# Patient Record
Sex: Male | Born: 1990 | Race: White | Hispanic: No | Marital: Married | Smoking: Current every day smoker
Health system: Southern US, Community
[De-identification: ages and names within clinical notes are randomized; demographics above are authoritative.]

---
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
## 2008-06-08 ENCOUNTER — Emergency Department: Payer: Self-pay | Admitting: Emergency Medicine

## 2009-06-04 ENCOUNTER — Inpatient Hospital Stay: Payer: Self-pay | Admitting: Unknown Physician Specialty

## 2011-05-09 ENCOUNTER — Emergency Department: Payer: Self-pay | Admitting: Emergency Medicine

## 2014-06-12 ENCOUNTER — Emergency Department: Payer: Self-pay | Admitting: Emergency Medicine

## 2016-06-05 ENCOUNTER — Encounter: Payer: Self-pay | Admitting: Emergency Medicine

## 2016-06-05 ENCOUNTER — Emergency Department
Admission: EM | Admit: 2016-06-05 | Discharge: 2016-06-05 | Disposition: A | Discharge status: Home / Self Care | Payer: Managed Care, Other (non HMO) | Attending: Emergency Medicine | Admitting: Emergency Medicine

## 2016-06-05 ENCOUNTER — Emergency Department: Payer: Managed Care, Other (non HMO)

## 2016-06-05 DIAGNOSIS — M545 Low back pain, unspecified: Secondary | ICD-10-CM

## 2016-06-05 DIAGNOSIS — W010XXA Fall on same level from slipping, tripping and stumbling without subsequent striking against object, initial encounter: Secondary | ICD-10-CM | POA: Insufficient documentation

## 2016-06-05 DIAGNOSIS — Y939 Activity, unspecified: Secondary | ICD-10-CM | POA: Diagnosis not present

## 2016-06-05 DIAGNOSIS — G8929 Other chronic pain: Secondary | ICD-10-CM

## 2016-06-05 DIAGNOSIS — Y929 Unspecified place or not applicable: Secondary | ICD-10-CM | POA: Insufficient documentation

## 2016-06-05 DIAGNOSIS — Y999 Unspecified external cause status: Secondary | ICD-10-CM | POA: Diagnosis not present

## 2016-06-05 DIAGNOSIS — S4992XA Unspecified injury of left shoulder and upper arm, initial encounter: Secondary | ICD-10-CM | POA: Diagnosis present

## 2016-06-05 DIAGNOSIS — S46912A Strain of unspecified muscle, fascia and tendon at shoulder and upper arm level, left arm, initial encounter: Secondary | ICD-10-CM | POA: Diagnosis not present

## 2016-06-05 MED ORDER — TRAMADOL HCL 50 MG PO TABS: 50.0000 mg | ORAL_TABLET | Freq: Four times a day (QID) | ORAL | 0 refills | Status: DC | PRN

## 2016-06-05 MED ORDER — METHOCARBAMOL 750 MG PO TABS: 750.0000 mg | ORAL_TABLET | Freq: Four times a day (QID) | ORAL | 0 refills | Status: DC

## 2016-06-05 NOTE — ED Triage Notes (Signed)
Pt reports hx of back pain with his current job, reports he slipped yesterday and is now having left shoulder/upper back pain. Pt ambulatory with full ROM in triage.

## 2016-06-05 NOTE — ED Notes (Signed)
See triage note  Slipped yesterday   Having pain to upper back and into left shoulder   No deformity noted  Ambulates well to treatment area

## 2016-06-05 NOTE — ED Provider Notes (Signed)
[facility] Emergency Department Provider Note   ____________________________________________   First MD Initiated Contact with Patient 06/05/16 1003     (approximate)  I have reviewed the triage vital signs and the nursing notes.   HISTORY  Chief Complaint Back Pain    HPI Jack Fields is a 26 y.o. male patient complaining of low back pain and left posterior shoulder pain secondary to a slip and fall yesterday. Patient has a history of chronic low back pain which has never been evaluated. Patient is back pain worsen secondary to his fall. Patient denies any radicular component to his back pain. Patient denies any bladder or bowel dysfunction. Patient state pain and left scapular area secondary with abduction overhead reaching of the left upper extremity. Patient rates his pain as 8/10. Patient described the pain as "achy". Patient stated no relief taking over-the-counter anti-inflammatory medications. History reviewed. No pertinent past medical history.  There are no active problems to display for this patient.   No past surgical history on file.  Prior to Admission medications   Medication Sig Start Date End Date Taking? Authorizing Provider  methocarbamol (ROBAXIN-750) 750 MG tablet Take 1 tablet (750 mg total) by mouth 4 (four) times daily. 06/05/16   Joni Reiningonald K Smith, PA-C  traMADol (ULTRAM) 50 MG tablet Take 1 tablet (50 mg total) by mouth every 6 (six) hours as needed. 06/05/16 06/05/17  Joni Reiningonald K Smith, PA-C    Allergies Patient has no known allergies.  No family history on file.  Social History Social History  Substance Use Topics  . Smoking status: Not on file  . Smokeless tobacco: Not on file  . Alcohol use Not on file    Review of Systems Constitutional: No fever/chills Eyes: No visual changes. ENT: No sore throat. Cardiovascular: Denies chest pain. Respiratory: Denies shortness of breath. Gastrointestinal: No abdominal pain.   No nausea, no vomiting.  No diarrhea.  No constipation. Genitourinary: Negative for dysuria. Musculoskeletal: Left posterior shoulder and back pain. Skin: Negative for rash. Neurological: Negative for headaches, focal weakness or numbness.   ____________________________________________   PHYSICAL EXAM:  VITAL SIGNS: ED Triage Vitals  Enc Vitals Group     BP 06/05/16 0900 140/81     Pulse Rate 06/05/16 0900 86     Resp 06/05/16 0900 16     Temp 06/05/16 0900 98.2 F (36.8 C)     Temp Source 06/05/16 0900 Oral     SpO2 06/05/16 0900 98 %     Weight 06/05/16 0901 190 lb (86.2 kg)     Height 06/05/16 0901 5\' 9"  (1.753 m)     Head Circumference --      Peak Flow --      Pain Score 06/05/16 0901 8     Pain Loc --      Pain Edu? --      Excl. in GC? --     Constitutional: Alert and oriented. Well appearing and in no acute distress. Eyes: Conjunctivae are normal. PERRL. EOMI. Head: Atraumatic. Nose: No congestion/rhinnorhea. Mouth/Throat: Mucous membranes are moist.  Oropharynx non-erythematous. Neck: No stridor.  No cervical spine tenderness to palpation. Hematological/Lymphatic/Immunilogical: No cervical lymphadenopathy. Cardiovascular: Normal rate, regular rhythm. Grossly normal heart sounds.  Good peripheral circulation. Respiratory: Normal respiratory effort.  No retractions. Lungs CTAB. Gastrointestinal: Soft and nontender. No distention. No abdominal bruits. No CVA tenderness. Musculoskeletal: No obvious deformity to the left posterior shoulder. Patient is moderate guarding palpation mid scapular muscle group. Patient has  decreased range of motion to the back complaining of pain with abduction and opiate reaching. Examination of the back shows no obvious spinal deformity. Patient has moderate guarding palpation L4-S1. Patient decreased range of motion with flexion and back complaining of pain. Patient has negative straight leg test. Neurologic:  Normal speech and language.  No gross focal neurologic deficits are appreciated. No gait instability. Skin:  Skin is warm, dry and intact. No rash noted. Psychiatric: Mood and affect are normal. Speech and behavior are normal.  ____________________________________________   LABS (all labs ordered are listed, but only abnormal results are displayed)  Labs Reviewed - No data to display ____________________________________________  EKG   ____________________________________________  RADIOLOGY  X-rays reveal only mild degenerative changes of the lumbar spine. ____________________________________________   PROCEDURES  Procedure(s) performed: None  Procedures  Critical Care performed: No  ____________________________________________   INITIAL IMPRESSION / ASSESSMENT AND PLAN / ED COURSE  Pertinent labs & imaging results that were available during my care of the patient were reviewed by me and considered in my medical decision making (see chart for details).  Left scapular muscle strain and low back pain. Patient given discharge care instructions. Patient given work no. Patient given prescription for tramadol and Robaxin. Patient advised to follow-up family doctor condition persists.      ____________________________________________   FINAL CLINICAL IMPRESSION(S) / ED DIAGNOSES  Final diagnoses:  Muscle strain of left scapular region, initial encounter  Chronic midline low back pain without sciatica      NEW MEDICATIONS STARTED DURING THIS VISIT:  New Prescriptions   METHOCARBAMOL (ROBAXIN-750) 750 MG TABLET    Take 1 tablet (750 mg total) by mouth 4 (four) times daily.   TRAMADOL (ULTRAM) 50 MG TABLET    Take 1 tablet (50 mg total) by mouth every 6 (six) hours as needed.     Note:  This document was prepared using Dragon voice recognition software and may include unintentional dictation errors.    Joni Reining, PA-C 06/05/16 1049    Minna Antis, MD 06/05/16 1450

## 2016-09-12 ENCOUNTER — Emergency Department
Admission: EM | Admit: 2016-09-12 | Discharge: 2016-09-12 | Disposition: A | Discharge status: Home / Self Care | Payer: Managed Care, Other (non HMO) | Attending: Emergency Medicine | Admitting: Emergency Medicine

## 2016-09-12 ENCOUNTER — Encounter: Payer: Self-pay | Admitting: Emergency Medicine

## 2016-09-12 DIAGNOSIS — R21 Rash and other nonspecific skin eruption: Secondary | ICD-10-CM | POA: Insufficient documentation

## 2016-09-12 DIAGNOSIS — F17200 Nicotine dependence, unspecified, uncomplicated: Secondary | ICD-10-CM | POA: Diagnosis not present

## 2016-09-12 MED ORDER — HYDROCORTISONE 1 % EX CREA: 1.0000 "application " | TOPICAL_CREAM | Freq: Three times a day (TID) | CUTANEOUS | 0 refills | Status: DC

## 2016-09-12 NOTE — ED Provider Notes (Signed)
Jack Fields Provider Note   ____________________________________________   I have reviewed the triage vital signs and the nursing notes.   HISTORY  Chief Complaint Rash    HPI Jack Fields is a 26 y.o. male presents with rash along bilateral inner thighs for approximately 1 year. Patient describes raised, erythematous and itchy when in hot, humid environments. The rash appears like stretch marks, non-infective along the inner thighs. He denies recent weight change.    History reviewed. No pertinent past medical history.  There are no active problems to display for this patient.   History reviewed. No pertinent surgical history.  Prior to Admission medications   Medication Sig Start Date End Date Taking? Authorizing Provider  hydrocortisone cream 1 % Apply 1 application topically 3 (three) times daily. 09/12/16   Chrisma Hurlock M, PA-C  methocarbamol (ROBAXIN-750) 750 MG tablet Take 1 tablet (750 mg total) by mouth 4 (four) times daily. 06/05/16   Joni Reining, PA-C  traMADol (ULTRAM) 50 MG tablet Take 1 tablet (50 mg total) by mouth every 6 (six) hours as needed. 06/05/16 06/05/17  Joni Reining, PA-C    Allergies Patient has no known allergies.  No family history on file.  Social History Social History  Substance Use Topics  . Smoking status: Current Every Day Smoker  . Smokeless tobacco: Never Used  . Alcohol use No    Review of Systems Constitutional: No fever/chills ENT: No sore throat. Cardiovascular: Denies chest pain. Respiratory: Denies cough Gastrointestinal: No abdominal pain.  No nausea, no vomiting.   Genitourinary: Negative for dysuria. Musculoskeletal: Negative for back pain. Skin: Rash along bilateral inner thighs. Neurological: Negative for headaches  ____________________________________________   PHYSICAL EXAM:  VITAL SIGNS: ED Triage Vitals  Enc Vitals Group     BP 09/12/16 1123  115/85     Pulse Rate 09/12/16 1116 80     Resp 09/12/16 1116 15     Temp 09/12/16 1116 98.4 F (36.9 C)     Temp Source 09/12/16 1116 Oral     SpO2 09/12/16 1116 100 %     Weight 09/12/16 1123 190 lb (86.2 kg)     Height 09/12/16 1123 5\' 10"  (1.778 m)     Head Circumference --      Peak Flow --      Pain Score --      Pain Loc --      Pain Edu? --      Excl. in GC? --     Constitutional: Alert and oriented. Well appearing and in no acute distress. Head: Atraumatic. Cardiovascular: Normal rate, regular rhythm.  Good peripheral circulation. Respiratory: Normal respiratory effort.  Genitourinary: deferred Musculoskeletal: No lower extremity tenderness nor edema.  No joint effusions. Neurologic:  Normal speech and language. No gross focal neurologic deficits are appreciated.  Skin:  Skin is warm, dry and intact. Bilateral inner thigh rash: stretch mark-like. Non-scaly, not-raised.  Psychiatric: Mood and affect are normal. Speech and behavior are normal.  ____________________________________________   LABS (all labs ordered are listed, but only abnormal results are displayed)  Labs Reviewed - No data to display ____________________________________________  EKG none  ____________________________________________  RADIOLOGY none ____________________________________________   PROCEDURES  Procedure(s) performed: none    Critical Care performed: no ____________________________________________   INITIAL IMPRESSION / ASSESSMENT AND PLAN / ED COURSE  Pertinent labs & imaging results that were available during my care of the patient were reviewed by me and considered in  my medical decision making (see chart for details).  Patient presents with intermittent inflammed rash along bilateral inner thighs. Generally more symptomatic with hot and humid environments. The rash appears stretch mark-like and non-infective. Patient will be prescribed 1% Cortisone cream to apply for  7 days and recommended to follow up with his PCP as needed or emergency Fields if symptoms worsen.      __________________________________________   FINAL CLINICAL IMPRESSION(S) / ED DIAGNOSES  Final diagnoses:  Rash and nonspecific skin eruption      NEW MEDICATIONS STARTED DURING THIS VISIT:  Discharge Medication List as of 09/12/2016 11:38 AM    START taking these medications   Details  hydrocortisone cream 1 % Apply 1 application topically 3 (three) times daily., Starting Tue 09/12/2016, Print         Note:  This document was prepared using Dragon voice recognition software and may include unintentional dictation errors.   [name], [name] M, PA-C 09/12/16 1348    Jene EveryKinner, Robert, MD 09/12/16 1351

## 2016-09-12 NOTE — ED Triage Notes (Signed)
States he developed rash to inside of upper legs   States hx of same in past  Worse when area gets hot

## 2017-04-02 ENCOUNTER — Emergency Department
Admission: EM | Admit: 2017-04-02 | Discharge: 2017-04-02 | Disposition: A | Discharge status: Home / Self Care | Payer: Managed Care, Other (non HMO) | Attending: Emergency Medicine | Admitting: Emergency Medicine

## 2017-04-02 ENCOUNTER — Other Ambulatory Visit: Payer: Self-pay

## 2017-04-02 DIAGNOSIS — X509XXA Other and unspecified overexertion or strenuous movements or postures, initial encounter: Secondary | ICD-10-CM | POA: Diagnosis not present

## 2017-04-02 DIAGNOSIS — Y998 Other external cause status: Secondary | ICD-10-CM | POA: Diagnosis not present

## 2017-04-02 DIAGNOSIS — Y929 Unspecified place or not applicable: Secondary | ICD-10-CM | POA: Diagnosis not present

## 2017-04-02 DIAGNOSIS — T148XXA Other injury of unspecified body region, initial encounter: Secondary | ICD-10-CM

## 2017-04-02 DIAGNOSIS — M545 Low back pain, unspecified: Secondary | ICD-10-CM

## 2017-04-02 DIAGNOSIS — Z79899 Other long term (current) drug therapy: Secondary | ICD-10-CM | POA: Diagnosis not present

## 2017-04-02 DIAGNOSIS — Y9389 Activity, other specified: Secondary | ICD-10-CM | POA: Diagnosis not present

## 2017-04-02 DIAGNOSIS — F17200 Nicotine dependence, unspecified, uncomplicated: Secondary | ICD-10-CM | POA: Diagnosis not present

## 2017-04-02 DIAGNOSIS — S3992XA Unspecified injury of lower back, initial encounter: Secondary | ICD-10-CM | POA: Diagnosis present

## 2017-04-02 DIAGNOSIS — S39012A Strain of muscle, fascia and tendon of lower back, initial encounter: Secondary | ICD-10-CM | POA: Insufficient documentation

## 2017-04-02 LAB — URINALYSIS, COMPLETE (UACMP) WITH MICROSCOPIC
Bacteria, UA: NONE SEEN
Bilirubin Urine: NEGATIVE
Glucose, UA: NEGATIVE mg/dL
Hgb urine dipstick: NEGATIVE
KETONES UR: NEGATIVE mg/dL
Leukocytes, UA: NEGATIVE
Nitrite: NEGATIVE
PROTEIN: NEGATIVE mg/dL
Specific Gravity, Urine: 1.019 (ref 1.005–1.030)
Squamous Epithelial / LPF: NONE SEEN
pH: 7 (ref 5.0–8.0)

## 2017-04-02 MED ORDER — IBUPROFEN 400 MG PO TABS
600.0000 mg | ORAL_TABLET | Freq: Once | ORAL | Status: AC
  Administered 2017-04-02: 600 mg via ORAL
  Filled 2017-04-02: qty 2

## 2017-04-02 MED ORDER — IBUPROFEN 600 MG PO TABS: 600.0000 mg | ORAL_TABLET | Freq: Three times a day (TID) | ORAL | 0 refills | Status: DC | PRN

## 2017-04-02 MED ORDER — METHOCARBAMOL 500 MG PO TABS
1000.0000 mg | ORAL_TABLET | Freq: Once | ORAL | Status: AC
  Administered 2017-04-02: 1000 mg via ORAL
  Filled 2017-04-02 (×2): qty 2

## 2017-04-02 MED ORDER — METHOCARBAMOL 500 MG PO TABS: 500.0000 mg | ORAL_TABLET | Freq: Four times a day (QID) | ORAL | 0 refills | Status: DC | PRN

## 2017-04-02 NOTE — ED Provider Notes (Signed)
[facility] Emergency Department Provider Note  ____________________________________________   First MD Initiated Contact with Patient 04/02/17 1109     (approximate)  I have reviewed the triage vital signs and the nursing notes.   HISTORY  Chief Complaint Flank Pain    HPI Jack Fields is a 26 y.o. male who self presents to the emergency department requesting a work note for left-sided low back pain.  He works as a Estate agentforklift operator and for the past 4 days or so he has had moderate severity throbbing aching left low back pain.  Pain is nonradiating.  It is worse with movement and lifting and improved with rest.  He has taken Flexeril in the past for identical symptoms with minimal relief.  He went to work today was advised to come to the hospital to get a work note otherwise he could not come back.  He denies bowel or bladder incontinence or heaviness.  He denies fevers or chills.  He denies IV drug use.     No past medical history on file.  There are no active problems to display for this patient.   No past surgical history on file.  Prior to Admission medications   Medication Sig Start Date End Date Taking? Authorizing Provider  hydrocortisone cream 1 % Apply 1 application topically 3 (three) times daily. 09/12/16   Little, Traci M, PA-C  ibuprofen (ADVIL,MOTRIN) 600 MG tablet Take 1 tablet (600 mg total) by mouth every 8 (eight) hours as needed. 04/02/17   Merrily Brittleifenbark, Catelyn Friel, MD  methocarbamol (ROBAXIN) 500 MG tablet Take 1 tablet (500 mg total) by mouth every 6 (six) hours as needed for muscle spasms. 04/02/17   Merrily Brittleifenbark, Miklo Aken, MD  methocarbamol (ROBAXIN-750) 750 MG tablet Take 1 tablet (750 mg total) by mouth 4 (four) times daily. 06/05/16   Joni ReiningSmith, Ronald K, PA-C  traMADol (ULTRAM) 50 MG tablet Take 1 tablet (50 mg total) by mouth every 6 (six) hours as needed. 06/05/16 06/05/17  Joni ReiningSmith, Ronald K, PA-C    Allergies Patient has no known  allergies.  No family history on file.  Social History Social History   Tobacco Use  . Smoking status: Current Every Day Smoker  . Smokeless tobacco: Never Used  Substance Use Topics  . Alcohol use: No  . Drug use: Not on file    Review of Systems Constitutional: No fever/chills ENT: No sore throat. Cardiovascular: Denies chest pain. Respiratory: Denies shortness of breath. Gastrointestinal: No abdominal pain.  No nausea, no vomiting.  No diarrhea.  No constipation. Musculoskeletal: Positive for back pain. Neurological: Negative for headaches   ____________________________________________   PHYSICAL EXAM:  VITAL SIGNS: ED Triage Vitals  Enc Vitals Group     BP 04/02/17 0949 (!) 154/90     Pulse Rate 04/02/17 0949 81     Resp 04/02/17 0949 20     Temp 04/02/17 0949 98.1 F (36.7 C)     Temp Source 04/02/17 0949 Oral     SpO2 04/02/17 0949 99 %     Weight 04/02/17 0950 200 lb (90.7 kg)     Height 04/02/17 0950 6' (1.829 m)     Head Circumference --      Peak Flow --      Pain Score 04/02/17 0949 6     Pain Loc --      Pain Edu? --      Excl. in GC? --     Constitutional: Alert and oriented x4 pleasant cooperative  speaks full clear sentences no diaphoresis Head: Atraumatic. Nose: No congestion/rhinnorhea. Mouth/Throat: No trismus Neck: No stridor.   Cardiovascular: Regular rate and rhythm Respiratory: Normal respiratory effort.  No retractions. MSK: No midline back tenderness he is tender left low back lumbar with some spasm normal neurological exam Neurologic:  Normal speech and language. No gross focal neurologic deficits are appreciated.  Skin:  Skin is warm, dry and intact. No rash noted.    ____________________________________________  LABS (all labs ordered are listed, but only abnormal results are displayed)  Labs Reviewed  URINALYSIS, COMPLETE (UACMP) WITH MICROSCOPIC - Abnormal; Notable for the following components:      Result Value    Color, Urine YELLOW (*)    APPearance CLEAR (*)    All other components within normal limits     __________________________________________  EKG   ____________________________________________  RADIOLOGY   ____________________________________________   DIFFERENTIAL includes but not limited to  Muscle spasm, radicular pain, cauda equina, epidural abscess, kidney stone   PROCEDURES  Procedure(s) performed: no  Procedures  Critical Care performed: no  Observation: no ____________________________________________   INITIAL IMPRESSION / ASSESSMENT AND PLAN / ED COURSE  Pertinent labs & imaging results that were available during my care of the patient were reviewed by me and considered in my medical decision making (see chart for details).  Patient is very well-appearing with normal neurological exam.  Urinalysis is negative for blood in his history is not consistent with renal colic.  This feels identical to previous episodes of low back pain with spasm.  Given Robaxin today for symptomatic control as he has failed Flexeril in the past.  Strict return precautions have been given to the patient verbalized understanding and agreement with plan.      ____________________________________________   FINAL CLINICAL IMPRESSION(S) / ED DIAGNOSES  Final diagnoses:  Acute left-sided low back pain without sciatica  Muscle strain      NEW MEDICATIONS STARTED DURING THIS VISIT:  This SmartLink is deprecated. Use AVSMEDLIST instead to display the medication list for a patient.   Note:  This document was prepared using Dragon voice recognition software and may include unintentional dictation errors.      Merrily Brittleifenbark, Dawud Mays, MD 04/02/17 1145

## 2017-04-02 NOTE — ED Triage Notes (Signed)
Pt reports that he is having left flank pain since Thanksgiving. He denies any pain or blood during urination.

## 2017-04-02 NOTE — Discharge Instructions (Signed)
Please take your muscle relaxants as needed for severe symptoms and follow-up with your primary care physician as needed.  Return to the emergency department for any concerns whatsoever.  It was a pleasure to take care of you today, and thank you for coming to our emergency department.  If you have any questions or concerns before leaving please ask the nurse to grab me and I'm more than happy to go through your aftercare instructions again.  If you were prescribed any opioid pain medication today such as Norco, Vicodin, Percocet, morphine, hydrocodone, or oxycodone please make sure you do not drive when you are taking this medication as it can alter your ability to drive safely.  If you have any concerns once you are home that you are not improving or are in fact getting worse before you can make it to your follow-up appointment, please do not hesitate to call 911 and come back for further evaluation.  Merrily BrittleNeil Ishita Mcnerney, MD  Results for orders placed or performed during the hospital encounter of 04/02/17  Urinalysis, Complete w Microscopic  Result Value Ref Range   Color, Urine YELLOW (A) YELLOW   APPearance CLEAR (A) CLEAR   Specific Gravity, Urine 1.019 1.005 - 1.030   pH 7.0 5.0 - 8.0   Glucose, UA NEGATIVE NEGATIVE mg/dL   Hgb urine dipstick NEGATIVE NEGATIVE   Bilirubin Urine NEGATIVE NEGATIVE   Ketones, ur NEGATIVE NEGATIVE mg/dL   Protein, ur NEGATIVE NEGATIVE mg/dL   Nitrite NEGATIVE NEGATIVE   Leukocytes, UA NEGATIVE NEGATIVE   RBC / HPF 0-5 0 - 5 RBC/hpf   WBC, UA 0-5 0 - 5 WBC/hpf   Bacteria, UA NONE SEEN NONE SEEN   Squamous Epithelial / LPF NONE SEEN NONE SEEN   Mucus PRESENT

## 2017-05-10 ENCOUNTER — Emergency Department
Admission: EM | Admit: 2017-05-10 | Discharge: 2017-05-10 | Disposition: A | Discharge status: Home / Self Care | Payer: Managed Care, Other (non HMO) | Attending: Emergency Medicine | Admitting: Emergency Medicine

## 2017-05-10 ENCOUNTER — Other Ambulatory Visit: Payer: Self-pay

## 2017-05-10 ENCOUNTER — Encounter: Payer: Self-pay | Admitting: Emergency Medicine

## 2017-05-10 DIAGNOSIS — J1189 Influenza due to unidentified influenza virus with other manifestations: Secondary | ICD-10-CM | POA: Insufficient documentation

## 2017-05-10 DIAGNOSIS — J111 Influenza due to unidentified influenza virus with other respiratory manifestations: Secondary | ICD-10-CM

## 2017-05-10 DIAGNOSIS — F17200 Nicotine dependence, unspecified, uncomplicated: Secondary | ICD-10-CM | POA: Insufficient documentation

## 2017-05-10 DIAGNOSIS — J029 Acute pharyngitis, unspecified: Secondary | ICD-10-CM | POA: Diagnosis present

## 2017-05-10 LAB — GROUP A STREP BY PCR

## 2017-05-10 MED ORDER — OSELTAMIVIR PHOSPHATE 75 MG PO CAPS: 75.0000 mg | ORAL_CAPSULE | Freq: Two times a day (BID) | ORAL | 0 refills | Status: DC

## 2017-05-10 NOTE — Discharge Instructions (Signed)
Follow up with your regular doctor if not better in 3 days, use tylenol and motrin for fever and body aches as needed, take the medication as prescribed

## 2017-05-10 NOTE — ED Triage Notes (Signed)
Pt to ed with c/o sore throat since yesterday, and headache. Denies fever.

## 2017-05-10 NOTE — ED Provider Notes (Signed)
[facility] Emergency Department Provider Note  ____________________________________________   First MD Initiated Contact with Patient 05/10/17 1020     (approximate)  I have reviewed the triage vital signs and the nursing notes.   HISTORY  Chief Complaint Sore Throat    HPI Jack Fields is a 27 y.o. male complaints of sore throat times 1 day, he states he has been hoarse also, no fever or chills, he states he felt hot, some body aches, started with cough this morning, it is nonproductive, he is not taking any over-the-counter medicines to help his symptoms, he denies vomiting, diarrhea, chest pain or shortness of breath   History reviewed. No pertinent past medical history.  There are no active problems to display for this patient.   History reviewed. No pertinent surgical history.  Prior to Admission medications   Medication Sig Start Date End Date Taking? Authorizing Provider  oseltamivir (TAMIFLU) 75 MG capsule Take 1 capsule (75 mg total) by mouth 2 (two) times daily. 05/10/17   Faythe GheeFisher, Susan W, PA-C    Allergies Patient has no known allergies.  History reviewed. No pertinent family history.  Social History Social History   Tobacco Use  . Smoking status: Current Every Day Smoker  . Smokeless tobacco: Never Used  Substance Use Topics  . Alcohol use: No  . Drug use: No    Review of Systems  Constitutional: Unsure of fever/chills Eyes: No visual changes. ENT: Positive sore throat. Respiratory: Positive cough Genitourinary: Negative for dysuria. Musculoskeletal: Negative for back pain. Skin: Negative for rash.    ____________________________________________   PHYSICAL EXAM:  VITAL SIGNS: ED Triage Vitals [05/10/17 0852]  Enc Vitals Group     BP 135/77     Pulse Rate 88     Resp 18     Temp 98.2 F (36.8 C)     Temp Source Oral     SpO2 100 %     Weight 200 lb (90.7 kg)     Height      Head Circumference    Peak Flow      Pain Score 5     Pain Loc      Pain Edu?      Excl. in GC?     Constitutional: Alert and oriented. Well appearing and in no acute distress. Eyes: Conjunctivae are normal.  Head: Atraumatic. Nose: No congestion/rhinnorhea. Mouth/Throat: Mucous membranes are moist.  Throat is normal Cardiovascular: Normal rate, regular rhythm.  Heart sounds are normal Respiratory: Normal respiratory effort.  No retractions, lungs are clear to auscultation, voice is very hoarse, cough is dry and hacking GU: deferred Musculoskeletal: FROM all extremities, warm and well perfused Neurologic:  Normal speech and language.  Skin:  Skin is warm, dry and intact. No rash noted. Psychiatric: Mood and affect are normal. Speech and behavior are normal.  ____________________________________________   LABS (all labs ordered are listed, but only abnormal results are displayed)  Labs Reviewed  GROUP A STREP BY PCR - Abnormal; Notable for the following components:      Result Value   Group A Strep by PCR   (*)    Value: INVALID, UNABLE TO DETERMINE THE PRESENCE OF TARGET DNA DUE TO SPECIMEN INTEGRITY. RECOLLECTION REQUESTED.   All other components within normal limits  GROUP A STREP BY PCR   ____________________________________________   ____________________________________________  RADIOLOGY    ____________________________________________   PROCEDURES  Procedure(s) performed: No      ____________________________________________   INITIAL  IMPRESSION / ASSESSMENT AND PLAN / ED COURSE  Pertinent labs & imaging results that were available during my care of the patient were reviewed by me and considered in my medical decision making (see chart for details).  Patient is a 27 year old male complains of sore throat that started yesterday, on physical exam he has a dry hacking cough and a hoarse voice, his throat is not red, strep test was ordered by the nurse, the lab was unable to  result th this test due to an adequate swab, the patient When he has the flu, prescription for Tamiflu 75 mg twice daily was given, explained to him that he had an acute upper respiratory infection most likely had influenza, he was given a work note, he is not to be around people as long as he has a fever, he is to take over-the-counter Tylenol or ibuprofen, TheraFlu for symptoms, but patient states he understands all instructions and will comply with our recommendations, he was discharged in stable condition     As part of my medical decision making, I reviewed the following data within the electronic MEDICAL RECORD NUMBER ____________________________________________   FINAL CLINICAL IMPRESSION(S) / ED DIAGNOSES  Final diagnoses:  Influenza      NEW MEDICATIONS STARTED DURING THIS VISIT:  This SmartLink is deprecated. Use AVSMEDLIST instead to display the medication list for a patient.   Note:  This document was prepared using Dragon voice recognition software and may include unintentional dictation errors.    Faythe Ghee, PA-C 05/10/17 Kelby Fam, MD 05/23/17 [phone]

## 2017-05-10 NOTE — ED Notes (Signed)
See triage note  States he became hoarse yesterday  Woke up with sore throat this am  No fever

## 2017-06-04 ENCOUNTER — Emergency Department
Admission: EM | Admit: 2017-06-04 | Discharge: 2017-06-04 | Disposition: A | Discharge status: Home / Self Care | Payer: Managed Care, Other (non HMO) | Attending: Emergency Medicine | Admitting: Emergency Medicine

## 2017-06-04 ENCOUNTER — Encounter: Payer: Self-pay | Admitting: Medical Oncology

## 2017-06-04 DIAGNOSIS — R112 Nausea with vomiting, unspecified: Secondary | ICD-10-CM | POA: Insufficient documentation

## 2017-06-04 DIAGNOSIS — R197 Diarrhea, unspecified: Secondary | ICD-10-CM | POA: Insufficient documentation

## 2017-06-04 DIAGNOSIS — R1110 Vomiting, unspecified: Secondary | ICD-10-CM | POA: Diagnosis present

## 2017-06-04 DIAGNOSIS — F17200 Nicotine dependence, unspecified, uncomplicated: Secondary | ICD-10-CM | POA: Diagnosis not present

## 2017-06-04 LAB — COMPREHENSIVE METABOLIC PANEL
ALK PHOS: 84 U/L (ref 38–126)
ALT: 19 U/L (ref 17–63)
ANION GAP: 8 (ref 5–15)
AST: 22 U/L (ref 15–41)
Albumin: 5.1 g/dL — ABNORMAL HIGH (ref 3.5–5.0)
BILIRUBIN TOTAL: 1.4 mg/dL — AB (ref 0.3–1.2)
BUN: 20 mg/dL (ref 6–20)
CALCIUM: 9.6 mg/dL (ref 8.9–10.3)
CO2: 28 mmol/L (ref 22–32)
Chloride: 101 mmol/L (ref 101–111)
Creatinine, Ser: 1.28 mg/dL — ABNORMAL HIGH (ref 0.61–1.24)
Glucose, Bld: 119 mg/dL — ABNORMAL HIGH (ref 65–99)
Potassium: 5 mmol/L (ref 3.5–5.1)
SODIUM: 137 mmol/L (ref 135–145)
TOTAL PROTEIN: 8.1 g/dL (ref 6.5–8.1)

## 2017-06-04 LAB — CBC
HCT: 50.2 % (ref 40.0–52.0)
HEMOGLOBIN: 16.8 g/dL (ref 13.0–18.0)
MCH: 30.7 pg (ref 26.0–34.0)
MCHC: 33.5 g/dL (ref 32.0–36.0)
MCV: 91.6 fL (ref 80.0–100.0)
Platelets: 230 10*3/uL (ref 150–440)
RBC: 5.48 MIL/uL (ref 4.40–5.90)
RDW: 14.1 % (ref 11.5–14.5)
WBC: 8.3 10*3/uL (ref 3.8–10.6)

## 2017-06-04 LAB — LIPASE, BLOOD: Lipase: 26 U/L (ref 11–51)

## 2017-06-04 MED ORDER — ONDANSETRON 4 MG PO TBDP: ORAL_TABLET | ORAL | 0 refills | Status: DC

## 2017-06-04 MED ORDER — ONDANSETRON 4 MG PO TBDP
4.0000 mg | ORAL_TABLET | Freq: Once | ORAL | Status: AC
  Administered 2017-06-04: 4 mg via ORAL
  Filled 2017-06-04: qty 1

## 2017-06-04 NOTE — Discharge Instructions (Signed)

## 2017-06-04 NOTE — ED Notes (Signed)
Pt states started "getting sick" as in N&V around 1am this morning. States central abd pain. States diarrhea x 2 hours. Alert, oriented, ambulatory. No distress noted. Unsure of fever. Unsure if he has been around anyone sick recently. Still has gallbladder and appendix.

## 2017-06-04 NOTE — ED Triage Notes (Signed)
Pt reports he began having nausea vomiting and diarrhea last night. Pt in NAD at this time. Drinking Mt dew in lobby prior to triage.

## 2017-06-04 NOTE — ED Triage Notes (Signed)
FIRST NURSE NOTE-here for vomiting. ambulatory without difficulty. NAD

## 2017-06-04 NOTE — ED Provider Notes (Signed)
[facility] Emergency Department Provider Note  ____________________________________________   First MD Initiated Contact with Patient 06/04/17 1030     (approximate)  I have reviewed the triage vital signs and the nursing notes.   HISTORY  Chief Complaint Emesis    HPI Jack Fields is a 27 y.o. male who is otherwise healthy and presents for evaluation of acute onset vomiting and diarrhea during the night last night.  He was in his normal state of health when he went to bed last night but awoke between midnight and 1 AM with acute onset vomiting.  He says he has vomited at least 12 times since that time.  He started having multiple episodes of loose stool this morning.  He states the symptoms are the same as his young son had last week.  He has been able to [facility] some [facility] since coming to the emergency department.  He denies fever/chills, chest pain, shortness of breath, abdominal pain other than a little bit of muscle pain when he vomits, and dysuria.  Nothing in particular makes his symptoms worse and they have gotten better over the last couple of hours.  He was going to work but had to stop because of his vomiting and his employer told him to come by and get checked out.  He is in no acute distress at this time.  History reviewed. No pertinent past medical history.  There are no active problems to display for this patient.   History reviewed. No pertinent surgical history.  Prior to Admission medications   Medication Sig Start Date End Date Taking? Authorizing Provider  ondansetron (ZOFRAN ODT) 4 MG disintegrating tablet Allow 1-2 tablets to dissolve in your mouth every 8 hours as needed for nausea/vomiting 06/04/17   Loleta RoseForbach, Verdelle Valtierra, MD  oseltamivir (TAMIFLU) 75 MG capsule Take 1 capsule (75 mg total) by mouth 2 (two) times daily. 05/10/17   Faythe GheeFisher, Susan W, PA-C    Allergies Patient has no known allergies.  No family history on  file.  Social History Social History   Tobacco Use  . Smoking status: Current Every Day Smoker  . Smokeless tobacco: Never Used  Substance Use Topics  . Alcohol use: No  . Drug use: No    Review of Systems Constitutional: No fever/chills Eyes: No visual changes. ENT: No sore throat. Cardiovascular: Denies chest pain. Respiratory: Denies shortness of breath. Gastrointestinal: Acute onset vomiting and diarrhea starting last night persistent throughout the night, now improving.  No abdominal pain. Genitourinary: Negative for dysuria. Musculoskeletal: Negative for neck pain.  Negative for back pain. Integumentary: Negative for rash. Neurological: Negative for headaches, focal weakness or numbness.   ____________________________________________   PHYSICAL EXAM:  VITAL SIGNS: ED Triage Vitals  Enc Vitals Group     BP 06/04/17 0946 134/86     Pulse Rate 06/04/17 0946 89     Resp 06/04/17 0946 18     Temp 06/04/17 0946 98.4 F (36.9 C)     Temp Source 06/04/17 0946 Oral     SpO2 --      Weight 06/04/17 0947 88.5 kg (195 lb)     Height 06/04/17 0947 1.829 m (6')     Head Circumference --      Peak Flow --      Pain Score 06/04/17 0946 6     Pain Loc --      Pain Edu? --      Excl. in GC? --  Constitutional: Alert and oriented. Well appearing and in no acute distress. Eyes: Conjunctivae are normal.  Head: Atraumatic. Nose: No congestion/rhinnorhea. Mouth/Throat: Mucous membranes are moist. Neck: No stridor.  No meningeal signs.   Cardiovascular: Normal rate, regular rhythm. Good peripheral circulation. Grossly normal heart sounds. Respiratory: Normal respiratory effort.  No retractions. Lungs CTAB. Gastrointestinal: Soft and nontender. No distention.  Tolerating p.o. in the ED Musculoskeletal: No lower extremity tenderness nor edema. No gross deformities of extremities. Neurologic:  Normal speech and language. No gross focal neurologic deficits are appreciated.   Skin:  Skin is warm, dry and intact. No rash noted. Psychiatric: Mood and affect are normal. Speech and behavior are normal.  ____________________________________________   LABS (all labs ordered are listed, but only abnormal results are displayed)  Labs Reviewed  COMPREHENSIVE METABOLIC PANEL - Abnormal; Notable for the following components:      Result Value   Glucose, Bld 119 (*)    Creatinine, Ser 1.28 (*)    Albumin 5.1 (*)    Total Bilirubin 1.4 (*)    All other components within normal limits  LIPASE, BLOOD  CBC  URINALYSIS, COMPLETE (UACMP) WITH MICROSCOPIC   ____________________________________________  EKG  None - EKG not ordered by ED physician ____________________________________________  RADIOLOGY   No results found.  ____________________________________________   PROCEDURES  Critical Care performed: No   Procedure(s) performed:   Procedures   ____________________________________________   INITIAL IMPRESSION / ASSESSMENT AND PLAN / ED COURSE  As part of my medical decision making, I reviewed the following data within the electronic MEDICAL RECORD NUMBER Nursing notes reviewed and incorporated, Labs reviewed , Old chart reviewed and Notes from prior ED visits    Differential diagnosis includes, but is not limited to, viral gastroenteritis, appendicitis, biliary colic, alcoholic gastritis, SBO/ileus.  She is very well-appearing with normal vital signs.  Lab work is generally reassuring with a very slight elevation of his creatinine but a normal GFR.  Tolerating p.o. intake at this time.  He has no abdominal tenderness to palpation and is starting to feel better.  His son recently had the same symptoms.  There is no indication for any imaging at this time and I suspect he is suffering from a viral gastroenteritis.  He is comfortable with the plan for Zofran here, a prescription for Zofran, and I gave my usual and customary return precautions.      ____________________________________________  FINAL CLINICAL IMPRESSION(S) / ED DIAGNOSES  Final diagnoses:  Nausea vomiting and diarrhea     MEDICATIONS GIVEN DURING THIS VISIT:  Medications  ondansetron (ZOFRAN-ODT) disintegrating tablet 4 mg (4 mg Oral Given 06/04/17 1051)     ED Discharge Orders        Ordered    ondansetron (ZOFRAN ODT) 4 MG disintegrating tablet     06/04/17 1106       Note:  This document was prepared using Dragon voice recognition software and may include unintentional dictation errors.    Loleta Rose, MD 06/04/17 1108

## 2017-06-06 ENCOUNTER — Other Ambulatory Visit: Payer: Self-pay

## 2017-06-06 ENCOUNTER — Emergency Department
Admission: EM | Admit: 2017-06-06 | Discharge: 2017-06-06 | Disposition: A | Discharge status: Home / Self Care | Payer: Managed Care, Other (non HMO) | Attending: Emergency Medicine | Admitting: Emergency Medicine

## 2017-06-06 DIAGNOSIS — F17210 Nicotine dependence, cigarettes, uncomplicated: Secondary | ICD-10-CM | POA: Diagnosis not present

## 2017-06-06 DIAGNOSIS — Z79899 Other long term (current) drug therapy: Secondary | ICD-10-CM | POA: Diagnosis not present

## 2017-06-06 DIAGNOSIS — R112 Nausea with vomiting, unspecified: Secondary | ICD-10-CM | POA: Diagnosis present

## 2017-06-06 DIAGNOSIS — K529 Noninfective gastroenteritis and colitis, unspecified: Secondary | ICD-10-CM | POA: Diagnosis not present

## 2017-06-06 LAB — CBC
HCT: 47.2 % (ref 40.0–52.0)
HEMOGLOBIN: 16 g/dL (ref 13.0–18.0)
MCH: 30.7 pg (ref 26.0–34.0)
MCHC: 33.8 g/dL (ref 32.0–36.0)
MCV: 90.9 fL (ref 80.0–100.0)
Platelets: 196 10*3/uL (ref 150–440)
RBC: 5.2 MIL/uL (ref 4.40–5.90)
RDW: 14.2 % (ref 11.5–14.5)
WBC: 4.5 10*3/uL (ref 3.8–10.6)

## 2017-06-06 LAB — URINALYSIS, COMPLETE (UACMP) WITH MICROSCOPIC
Bacteria, UA: NONE SEEN
Bilirubin Urine: NEGATIVE
Glucose, UA: NEGATIVE mg/dL
KETONES UR: NEGATIVE mg/dL
Nitrite: NEGATIVE
PH: 5 (ref 5.0–8.0)
PROTEIN: 30 mg/dL — AB
Specific Gravity, Urine: 1.028 (ref 1.005–1.030)

## 2017-06-06 LAB — COMPREHENSIVE METABOLIC PANEL
ALBUMIN: 4.8 g/dL (ref 3.5–5.0)
ALT: 17 U/L (ref 17–63)
AST: 24 U/L (ref 15–41)
Alkaline Phosphatase: 92 U/L (ref 38–126)
Anion gap: 7 (ref 5–15)
BILIRUBIN TOTAL: 1 mg/dL (ref 0.3–1.2)
BUN: 16 mg/dL (ref 6–20)
CO2: 29 mmol/L (ref 22–32)
Calcium: 9.5 mg/dL (ref 8.9–10.3)
Chloride: 99 mmol/L — ABNORMAL LOW (ref 101–111)
Creatinine, Ser: 1.35 mg/dL — ABNORMAL HIGH (ref 0.61–1.24)
GFR calc Af Amer: >60 mL/min (ref >60)
GFR calc non Af Amer: >60 mL/min (ref >60)
GLUCOSE: 95 mg/dL (ref 65–99)
POTASSIUM: 3.9 mmol/L (ref 3.5–5.1)
Sodium: 135 mmol/L (ref 135–145)
TOTAL PROTEIN: 8.2 g/dL — AB (ref 6.5–8.1)

## 2017-06-06 LAB — LIPASE, BLOOD: Lipase: 29 U/L (ref 11–51)

## 2017-06-06 NOTE — ED Provider Notes (Signed)
[facility] Emergency Department Provider Note  Time seen: 11:52 AM  I have reviewed the triage vital signs and the nursing notes.   HISTORY  Chief Complaint Emesis    HPI Jack Fields is a 27 y.o. male with no past medical history who presents to the emergency department for nausea vomiting.  According to the patient for the past 4 days he has had nausea vomiting diarrhea.  States the diarrhea stopped yesterday he tried to go to work today but felt nauseated and had one episode of vomiting, states his boss made him go to the emergency department for evaluation.  Denies any abdominal pain besides mild cramping.  States his daughter at home has the same symptoms.  Was prescribed Zofran, did not take any today.  Denies any fever.  States he had a mild cough yesterday denies any cough congestion today.  Patient states he would not have come to the ER but his boss made him so he can get a work note.  History reviewed. No pertinent past medical history.  There are no active problems to display for this patient.   History reviewed. No pertinent surgical history.  Prior to Admission medications   Medication Sig Start Date End Date Taking? Authorizing Provider  ondansetron (ZOFRAN ODT) 4 MG disintegrating tablet Allow 1-2 tablets to dissolve in your mouth every 8 hours as needed for nausea/vomiting 06/04/17   Loleta RoseForbach, Cory, MD  oseltamivir (TAMIFLU) 75 MG capsule Take 1 capsule (75 mg total) by mouth 2 (two) times daily. 05/10/17   Faythe GheeFisher, Susan W, PA-C    No Known Allergies  No family history on file.  Social History Social History   Tobacco Use  . Smoking status: Current Every Day Smoker  . Smokeless tobacco: Never Used  Substance Use Topics  . Alcohol use: No  . Drug use: No    Review of Systems Constitutional: Negative for fever. Eyes: Negative for visual complaints ENT: Mild congestion yesterday. Cardiovascular: Negative for chest  pain. Respiratory: Negative for shortness of breath. Gastrointestinal: States mild diffuse cramping.  One episode of vomiting today.  No diarrhea today. Genitourinary: Negative for dysuria or hematuria Musculoskeletal: Negative for musculoskeletal complaints Skin: Negative for skin complaints  Neurological: Negative for headache All other ROS negative  ____________________________________________   PHYSICAL EXAM:  VITAL SIGNS: ED Triage Vitals  Enc Vitals Group     BP 06/06/17 1042 133/79     Pulse Rate 06/06/17 1042 82     Resp 06/06/17 1042 16     Temp 06/06/17 1042 98.3 F (36.8 C)     Temp Source 06/06/17 1042 Oral     SpO2 06/06/17 1042 99 %     Weight 06/06/17 1020 195 lb (88.5 kg)     Height 06/06/17 1020 6' (1.829 m)     Head Circumference --      Peak Flow --      Pain Score 06/06/17 1020 2     Pain Loc --      Pain Edu? --      Excl. in GC? --    Constitutional: Alert and oriented. Well appearing and in no distress. Eyes: Normal exam ENT   Head: Normocephalic and atraumatic   Mouth/Throat: Mucous membranes are moist. Cardiovascular: Normal rate, regular rhythm. No murmur Respiratory: Normal respiratory effort without tachypnea nor retractions. Breath sounds are clear  Gastrointestinal: Soft and nontender. No distention. Musculoskeletal: Nontender with normal range of motion in all extremities. No lower extremity  tenderness or edema. Neurologic:  Normal speech and language. No gross focal neurologic deficits  Skin:  Skin is warm, dry and intact.  Psychiatric: Mood and affect are normal.  ____________________________________________   INITIAL IMPRESSION / ASSESSMENT AND PLAN / ED COURSE  Pertinent labs & imaging results that were available during my care of the patient were reviewed by me and considered in my medical decision making (see chart for details).  Patient presents to the emergency department for nausea and vomiting.  States his daughter  has similar symptoms at home.  Differential would include neurovirus, rotavirus, other gastroenteritis, less likely gastritis.  Currently the patient appears well, no distress.  Labs are largely unchanged from 2 days ago per record review.  Urinalysis equivocal denies any dysuria or hematuria.  Urine culture has been sent.  Discussed with the patient the need to continue taking the Zofran as prescribed 2 days ago if needed.  Also drink plenty of fluids and obtain plenty of rest.  I will provide a work note for today for the patient.  ____________________________________________   FINAL CLINICAL IMPRESSION(S) / ED DIAGNOSES  Gastroenteritis    Minna Antis, MD 06/06/17 1155

## 2017-06-06 NOTE — ED Notes (Signed)
Jae DireKate RN aware of placement in room.

## 2017-06-06 NOTE — ED Triage Notes (Signed)
Pt states he was seen here 2 days ago with N/V/D , states he began having vomiting again but denies diarrhea. States his employer needed a work note.

## 2017-06-06 NOTE — ED Notes (Signed)
Darl PikesSusan in lab called to report that lavender topped blood tube had "clotted".  Tech Kadijah aware.

## 2017-06-22 ENCOUNTER — Encounter: Payer: Self-pay | Admitting: Emergency Medicine

## 2017-06-22 ENCOUNTER — Other Ambulatory Visit: Payer: Self-pay

## 2017-06-22 ENCOUNTER — Emergency Department
Admission: EM | Admit: 2017-06-22 | Discharge: 2017-06-22 | Disposition: A | Discharge status: Home / Self Care | Payer: Managed Care, Other (non HMO) | Attending: Emergency Medicine | Admitting: Emergency Medicine

## 2017-06-22 DIAGNOSIS — Z041 Encounter for examination and observation following transport accident: Secondary | ICD-10-CM | POA: Insufficient documentation

## 2017-06-22 DIAGNOSIS — M79601 Pain in right arm: Secondary | ICD-10-CM | POA: Insufficient documentation

## 2017-06-22 DIAGNOSIS — F17200 Nicotine dependence, unspecified, uncomplicated: Secondary | ICD-10-CM | POA: Diagnosis not present

## 2017-06-22 DIAGNOSIS — M549 Dorsalgia, unspecified: Secondary | ICD-10-CM | POA: Diagnosis not present

## 2017-06-22 MED ORDER — IBUPROFEN 800 MG PO TABS: 800.0000 mg | ORAL_TABLET | Freq: Three times a day (TID) | ORAL | 0 refills | Status: DC | PRN

## 2017-06-22 MED ORDER — CYCLOBENZAPRINE HCL 5 MG PO TABS: ORAL_TABLET | ORAL | 0 refills | Status: DC
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
## 2017-07-04 ENCOUNTER — Other Ambulatory Visit: Payer: Self-pay

## 2017-07-04 ENCOUNTER — Emergency Department
Admission: EM | Admit: 2017-07-04 | Discharge: 2017-07-04 | Disposition: A | Discharge status: Home / Self Care | Payer: Managed Care, Other (non HMO) | Attending: Emergency Medicine | Admitting: Emergency Medicine

## 2017-07-04 DIAGNOSIS — M545 Low back pain: Secondary | ICD-10-CM | POA: Diagnosis not present

## 2017-07-04 DIAGNOSIS — Z5321 Procedure and treatment not carried out due to patient leaving prior to being seen by health care provider: Secondary | ICD-10-CM | POA: Insufficient documentation

## 2017-07-04 NOTE — ED Notes (Signed)
Attempted to call for the pt in the [facility] with no response.

## 2017-07-04 NOTE — ED Triage Notes (Signed)
Pt c/o lower back pain, states he has back issues but yesterday it got worse after working. Pt ambulatory to triage.

## 2017-07-04 NOTE — ED Notes (Signed)
Attempted to call the pt from the [facility] with no response.

## 2017-07-04 NOTE — ED Notes (Signed)
Called pt without answer.

## 2017-09-19 ENCOUNTER — Emergency Department
Admission: EM | Admit: 2017-09-19 | Discharge: 2017-09-19 | Disposition: A | Discharge status: Home / Self Care | Payer: Managed Care, Other (non HMO) | Attending: Emergency Medicine | Admitting: Emergency Medicine

## 2017-09-19 DIAGNOSIS — R112 Nausea with vomiting, unspecified: Secondary | ICD-10-CM

## 2017-09-19 DIAGNOSIS — F17200 Nicotine dependence, unspecified, uncomplicated: Secondary | ICD-10-CM | POA: Insufficient documentation

## 2017-09-19 LAB — COMPREHENSIVE METABOLIC PANEL
ALBUMIN: 4.8 g/dL (ref 3.5–5.0)
ALT: 19 U/L (ref 17–63)
ANION GAP: 10 (ref 5–15)
AST: 21 U/L (ref 15–41)
Alkaline Phosphatase: 85 U/L (ref 38–126)
BUN: 16 mg/dL (ref 6–20)
CO2: 26 mmol/L (ref 22–32)
Calcium: 9.4 mg/dL (ref 8.9–10.3)
Chloride: 99 mmol/L — ABNORMAL LOW (ref 101–111)
Creatinine, Ser: 1.19 mg/dL (ref 0.61–1.24)
GFR calc non Af Amer: >60 mL/min (ref >60)
GLUCOSE: 105 mg/dL — AB (ref 65–99)
Potassium: 4 mmol/L (ref 3.5–5.1)
SODIUM: 135 mmol/L (ref 135–145)
TOTAL PROTEIN: 8.3 g/dL — AB (ref 6.5–8.1)
Total Bilirubin: 1.9 mg/dL — ABNORMAL HIGH (ref 0.3–1.2)

## 2017-09-19 LAB — URINALYSIS, COMPLETE (UACMP) WITH MICROSCOPIC
Bacteria, UA: NONE SEEN
Bilirubin Urine: NEGATIVE
Glucose, UA: NEGATIVE mg/dL
HGB URINE DIPSTICK: NEGATIVE
Ketones, ur: 5 mg/dL — AB
Leukocytes, UA: NEGATIVE
NITRITE: NEGATIVE
Protein, ur: 30 mg/dL — AB
SPECIFIC GRAVITY, URINE: 1.027 (ref 1.005–1.030)
Squamous Epithelial / LPF: NONE SEEN (ref 0–5)
pH: 5 (ref 5.0–8.0)

## 2017-09-19 LAB — LIPASE, BLOOD: Lipase: 23 U/L (ref 11–51)

## 2017-09-19 LAB — CBC
HEMATOCRIT: 47.7 % (ref 40.0–52.0)
HEMOGLOBIN: 16.4 g/dL (ref 13.0–18.0)
MCH: 31 pg (ref 26.0–34.0)
MCHC: 34.4 g/dL (ref 32.0–36.0)
MCV: 90.1 fL (ref 80.0–100.0)
Platelets: 254 10*3/uL (ref 150–440)
RBC: 5.29 MIL/uL (ref 4.40–5.90)
RDW: 13.5 % (ref 11.5–14.5)
WBC: 5.3 10*3/uL (ref 3.8–10.6)

## 2017-09-19 MED ORDER — ONDANSETRON 4 MG PO TBDP
4.0000 mg | ORAL_TABLET | Freq: Once | ORAL | Status: AC
  Administered 2017-09-19: 4 mg via ORAL
  Filled 2017-09-19: qty 1

## 2017-09-19 MED ORDER — FAMOTIDINE 20 MG PO TABS: 20.0000 mg | ORAL_TABLET | Freq: Two times a day (BID) | ORAL | 1 refills | Status: DC

## 2017-09-19 MED ORDER — FAMOTIDINE 20 MG PO TABS
20.0000 mg | ORAL_TABLET | Freq: Once | ORAL | Status: AC
  Administered 2017-09-19: 20 mg via ORAL
  Filled 2017-09-19: qty 1

## 2017-09-19 MED ORDER — ONDANSETRON 4 MG PO TBDP: 4.0000 mg | ORAL_TABLET | Freq: Three times a day (TID) | ORAL | 0 refills | Status: DC | PRN

## 2017-09-19 NOTE — ED Notes (Signed)
Pt ambulatory to treatment room. No distress noted.  

## 2017-09-19 NOTE — ED Triage Notes (Signed)
Emesis that started yesterday, X 3 today. Right eye irritation and watering. Pt alert and oriented X4, active, cooperative, pt in NAD. RR even and unlabored, color WNL.

## 2017-09-19 NOTE — ED Provider Notes (Signed)
[facility] Emergency Department Provider Note       Time seen: ----------------------------------------- 12:31 PM on 09/19/2017 -----------------------------------------   I have reviewed the triage vital signs and the nursing notes.  HISTORY   Chief Complaint Emesis; Eye Pain; and Headache    HPI Jack Fields is a 27 y.o. male with no significant past medical history who presents to the ED for vomiting that started yesterday with additional 3 times vomiting today.  Patient reports right eye irritation and watering which has resolved now.  He denies any specific abdominal pain or diarrhea.  Patient does not report frequent alcohol use or GI upset.  He denies any other complaints, had some [place] fries prior to my evaluation.  History reviewed. No pertinent past medical history.  There are no active problems to display for this patient.   History reviewed. No pertinent surgical history.  Allergies Patient has no known allergies.  Social History Social History   Tobacco Use  . Smoking status: Current Every Day Smoker  . Smokeless tobacco: Never Used  Substance Use Topics  . Alcohol use: No  . Drug use: No   Review of Systems Constitutional: Negative for fever. Eyes: Positive for right eye irritation Cardiovascular: Negative for chest pain. Respiratory: Negative for shortness of breath. Gastrointestinal: Negative for abdominal pain, positive for vomiting Musculoskeletal: Negative for back pain. Skin: Negative for rash. Neurological: Negative for headaches, focal weakness or numbness.  All systems negative/normal/unremarkable except as stated in the HPI  ____________________________________________   PHYSICAL EXAM:  VITAL SIGNS: ED Triage Vitals [09/19/17 1122]  Enc Vitals Group     BP 137/87     Pulse Rate (!) 104     Resp 18     Temp 99.2 F (37.3 C)     Temp Source Oral     SpO2 94 %     Weight 190 lb (86.2 kg)      Height 6' (1.829 m)     Head Circumference      Peak Flow      Pain Score 6     Pain Loc      Pain Edu?      Excl. in GC?    Constitutional: Alert and oriented. Well appearing and in no distress. Eyes: Conjunctivae are normal. Normal extraocular movements. ENT   Head: Normocephalic and atraumatic.   Nose: No congestion/rhinnorhea.   Mouth/Throat: Mucous membranes are moist.   Neck: No stridor. Cardiovascular: Normal rate, regular rhythm. No murmurs, rubs, or gallops. Respiratory: Normal respiratory effort without tachypnea nor retractions. Breath sounds are clear and equal bilaterally. No wheezes/rales/rhonchi. Gastrointestinal: Soft and nontender. Normal bowel sounds Musculoskeletal: Nontender with normal range of motion in extremities. No lower extremity tenderness nor edema. Neurologic:  Normal speech and language. No gross focal neurologic deficits are appreciated.  Skin:  Skin is warm, dry and intact. No rash noted. Psychiatric: Mood and affect are normal. Speech and behavior are normal.  ____________________________________________  ED COURSE:  As part of my medical decision making, I reviewed the following data within the electronic MEDICAL RECORD NUMBER History obtained from family if available, nursing notes, old chart and ekg, as well as notes from prior ED visits. Patient presented for vomiting, we will assess with labs and provide antiemetics.   Procedures ____________________________________________   LABS (pertinent positives/negatives)  Labs Reviewed  COMPREHENSIVE METABOLIC PANEL - Abnormal; Notable for the following components:      Result Value   Chloride 99 (*)  Glucose, Bld 105 (*)    Total Protein 8.3 (*)    Total Bilirubin 1.9 (*)    All other components within normal limits  URINALYSIS, COMPLETE (UACMP) WITH MICROSCOPIC - Abnormal; Notable for the following components:   Color, Urine YELLOW (*)    APPearance HAZY (*)    Ketones, ur 5 (*)     Protein, ur 30 (*)    All other components within normal limits  LIPASE, BLOOD  CBC  ___________________________________________  DIFFERENTIAL DIAGNOSIS   Gastroenteritis, gastritis, peptic ulcer disease  FINAL ASSESSMENT AND PLAN  Vomiting   Plan: The patient had presented for vomiting and was treated with Pepcid and Zofran. Patient's labs are reassuring, he is cleared for outpatient follow-up.   Ulice Dash, MD   Note: This note was generated in part or whole with voice recognition software. Voice recognition is usually quite accurate but there are transcription errors that can and very often do occur. I apologize for any typographical errors that were not detected and corrected.     Emily Filbert, MD 09/19/17 [phone]

## 2017-10-18 ENCOUNTER — Encounter: Payer: Self-pay | Admitting: Emergency Medicine

## 2017-10-18 ENCOUNTER — Emergency Department
Admission: EM | Admit: 2017-10-18 | Discharge: 2017-10-18 | Disposition: A | Discharge status: Home / Self Care | Payer: Self-pay | Attending: Emergency Medicine | Admitting: Emergency Medicine

## 2017-10-18 DIAGNOSIS — Y9389 Activity, other specified: Secondary | ICD-10-CM | POA: Insufficient documentation

## 2017-10-18 DIAGNOSIS — S39012A Strain of muscle, fascia and tendon of lower back, initial encounter: Secondary | ICD-10-CM | POA: Insufficient documentation

## 2017-10-18 DIAGNOSIS — W1789XA Other fall from one level to another, initial encounter: Secondary | ICD-10-CM | POA: Insufficient documentation

## 2017-10-18 DIAGNOSIS — Z79899 Other long term (current) drug therapy: Secondary | ICD-10-CM | POA: Insufficient documentation

## 2017-10-18 DIAGNOSIS — F17200 Nicotine dependence, unspecified, uncomplicated: Secondary | ICD-10-CM | POA: Insufficient documentation

## 2017-10-18 DIAGNOSIS — Y929 Unspecified place or not applicable: Secondary | ICD-10-CM | POA: Insufficient documentation

## 2017-10-18 DIAGNOSIS — Y990 Civilian activity done for income or pay: Secondary | ICD-10-CM | POA: Insufficient documentation

## 2017-10-18 MED ORDER — TRAMADOL HCL 50 MG PO TABS: 50.0000 mg | ORAL_TABLET | Freq: Two times a day (BID) | ORAL | 0 refills | Status: DC | PRN

## 2017-10-18 MED ORDER — CYCLOBENZAPRINE HCL 10 MG PO TABS: 10.0000 mg | ORAL_TABLET | Freq: Three times a day (TID) | ORAL | 0 refills | Status: DC | PRN

## 2017-10-18 MED ORDER — IBUPROFEN 600 MG PO TABS: 600.0000 mg | ORAL_TABLET | Freq: Three times a day (TID) | ORAL | 0 refills | Status: DC | PRN

## 2017-10-18 NOTE — ED Triage Notes (Signed)
Pt reports pulled a muscle in his lower back yesterday at work. Pt states that he works for [employer] but does not want this filed on [employer].

## 2017-10-18 NOTE — ED Provider Notes (Signed)
[facility] Emergency Department Provider Note   ____________________________________________   First MD Initiated Contact with Patient 10/18/17 [phone]     (approximate)  I have reviewed the triage vital signs and the nursing notes.   HISTORY  Chief Complaint Back Pain    HPI Jack Fields is a 27 y.o. male patient complain low back pain secondary to lifting pulling incident yesterday at work.  Patient the pain also was aggravated by jumping off a truck.  Patient denies radicular component to his back pain.  Patient denies bladder bowel dysfunction.  Patient rates the pain as a 7/10.  Patient described pain is "aching".  Patient had no relief with over-the-counter ibuprofen.  History reviewed. No pertinent past medical history.  There are no active problems to display for this patient.   History reviewed. No pertinent surgical history.  Prior to Admission medications   Medication Sig Start Date End Date Taking? Authorizing Provider  cyclobenzaprine (FLEXERIL) 10 MG tablet Take 1 tablet (10 mg total) by mouth 3 (three) times daily as needed. 10/18/17   Joni ReiningSmith, Gerilynn Mccullars K, PA-C  cyclobenzaprine (FLEXERIL) 5 MG tablet Take 1-2 tablets 3 times daily as needed 06/22/17   Enid DerryWagner, Ashley, PA-C  famotidine (PEPCID) 20 MG tablet Take 1 tablet (20 mg total) by mouth 2 (two) times daily. 09/19/17   Emily FilbertWilliams, Jonathan E, MD  ibuprofen (ADVIL,MOTRIN) 600 MG tablet Take 1 tablet (600 mg total) by mouth every 8 (eight) hours as needed. 10/18/17   Joni ReiningSmith, Esley Brooking K, PA-C  ibuprofen (ADVIL,MOTRIN) 800 MG tablet Take 1 tablet (800 mg total) by mouth every 8 (eight) hours as needed. 06/22/17   Enid DerryWagner, Ashley, PA-C  ondansetron (ZOFRAN ODT) 4 MG disintegrating tablet Allow 1-2 tablets to dissolve in your mouth every 8 hours as needed for nausea/vomiting 06/04/17   Loleta RoseForbach, Cory, MD  ondansetron (ZOFRAN ODT) 4 MG disintegrating tablet Take 1 tablet (4 mg total) by mouth every 8  (eight) hours as needed for nausea or vomiting. 09/19/17   Emily FilbertWilliams, Jonathan E, MD  oseltamivir (TAMIFLU) 75 MG capsule Take 1 capsule (75 mg total) by mouth 2 (two) times daily. 05/10/17   Fisher, Roselyn BeringSusan W, PA-C  traMADol (ULTRAM) 50 MG tablet Take 1 tablet (50 mg total) by mouth every 12 (twelve) hours as needed. 10/18/17   Joni ReiningSmith, Ruffin Lada K, PA-C    Allergies Patient has no known allergies.  No family history on file.  Social History Social History   Tobacco Use  . Smoking status: Current Every Day Smoker  . Smokeless tobacco: Never Used  Substance Use Topics  . Alcohol use: No  . Drug use: No    Review of Systems Constitutional: No fever/chills Eyes: No visual changes. ENT: No sore throat. Cardiovascular: Denies chest pain. Respiratory: Denies shortness of breath. Gastrointestinal: No abdominal pain.  No nausea, no vomiting.  No diarrhea.  No constipation. Genitourinary: Negative for dysuria. Musculoskeletal: Positive for back pain. Skin: Negative for rash. Neurological: Negative for headaches, focal weakness or numbness.   ____________________________________________   PHYSICAL EXAM:  VITAL SIGNS: ED Triage Vitals  Enc Vitals Group     BP 10/18/17 0837 (!) 136/92     Pulse Rate 10/18/17 0837 80     Resp --      Temp 10/18/17 0837 98 F (36.7 C)     Temp Source 10/18/17 0837 Oral     SpO2 10/18/17 0837 100 %     Weight 10/18/17 0836 175 lb (79.4 kg)  Height 10/18/17 0836 6' (1.829 m)     Head Circumference --      Peak Flow --      Pain Score 10/18/17 0835 7     Pain Loc --      Pain Edu? --      Excl. in GC? --    Constitutional: Alert and oriented. Well appearing and in no acute distress. Neck: No stridor.  No cervical spine tenderness to palpation. Cardiovascular: Normal rate, regular rhythm. Grossly normal heart sounds.  Good peripheral circulation. Respiratory: Normal respiratory effort.  No retractions. Lungs CTAB. Gastrointestinal: Soft and  nontender. No distention. No abdominal bruits. No CVA tenderness. Musculoskeletal: No obvious spinal deformity.  Patient has moderate guarding palpation of bilateral paraspinal muscles at L3-L5.  Patient has decreased range of motion with lateral movements.  Bilateral muscles spasms. Neurologic:  Normal speech and language. No gross focal neurologic deficits are appreciated. No gait instability. Skin:  Skin is warm, dry and intact. No rash noted. Psychiatric: Mood and affect are normal. Speech and behavior are normal.  ____________________________________________   LABS (all labs ordered are listed, but only abnormal results are displayed)  Labs Reviewed - No data to display ____________________________________________  EKG  ____________________________________________  RADIOLOGY  ED MD interpretation:    Official radiology report(s): No results found.  ____________________________________________   PROCEDURES  Procedure(s) performed: None  Procedures  Critical Care performed: No  ____________________________________________   INITIAL IMPRESSION / ASSESSMENT AND PLAN / ED COURSE  As part of my medical decision making, I reviewed the following data within the electronic MEDICAL RECORD NUMBER    Back pain secondary to strain.  Patient given discharge care instruction.  Patient advised take medication as directed.  Patient [facility].     ____________________________________________   FINAL CLINICAL IMPRESSION(S) / ED DIAGNOSES  Final diagnoses:  Strain of lumbar region, initial encounter     ED Discharge Orders        Ordered    traMADol (ULTRAM) 50 MG tablet  Every 12 hours PRN     10/18/17 0859    cyclobenzaprine (FLEXERIL) 10 MG tablet  3 times daily PRN     10/18/17 0859    ibuprofen (ADVIL,MOTRIN) 600 MG tablet  Every 8 hours PRN     10/18/17 0859       Note:  This document was prepared using  Dragon voice recognition software and may include unintentional dictation errors.    Joni Reining, PA-C 10/18/17 9562    Merrily Brittle, MD 10/18/17 [phone]

## 2017-10-18 NOTE — ED Notes (Signed)
See triage note  Presents with lower back pain  States he was working with a wet tarp last night   Felt a pull to lower back  Ambulates well to treatment area

## 2017-12-03 ENCOUNTER — Emergency Department
Admission: EM | Admit: 2017-12-03 | Discharge: 2017-12-03 | Disposition: A | Discharge status: Home / Self Care | Payer: Self-pay | Attending: Emergency Medicine | Admitting: Emergency Medicine

## 2017-12-03 ENCOUNTER — Encounter: Payer: Self-pay | Admitting: Emergency Medicine

## 2017-12-03 DIAGNOSIS — Y929 Unspecified place or not applicable: Secondary | ICD-10-CM | POA: Insufficient documentation

## 2017-12-03 DIAGNOSIS — Z79899 Other long term (current) drug therapy: Secondary | ICD-10-CM | POA: Insufficient documentation

## 2017-12-03 DIAGNOSIS — S51811A Laceration without foreign body of right forearm, initial encounter: Secondary | ICD-10-CM | POA: Insufficient documentation

## 2017-12-03 DIAGNOSIS — Z23 Encounter for immunization: Secondary | ICD-10-CM | POA: Insufficient documentation

## 2017-12-03 DIAGNOSIS — Y990 Civilian activity done for income or pay: Secondary | ICD-10-CM | POA: Insufficient documentation

## 2017-12-03 DIAGNOSIS — F17200 Nicotine dependence, unspecified, uncomplicated: Secondary | ICD-10-CM | POA: Insufficient documentation

## 2017-12-03 DIAGNOSIS — W228XXA Striking against or struck by other objects, initial encounter: Secondary | ICD-10-CM | POA: Insufficient documentation

## 2017-12-03 DIAGNOSIS — Y939 Activity, unspecified: Secondary | ICD-10-CM | POA: Insufficient documentation

## 2017-12-03 MED ORDER — LIDOCAINE HCL (PF) 1 % IJ SOLN
5.0000 mL | Freq: Once | INTRAMUSCULAR | Status: AC
  Administered 2017-12-03: 5 mL

## 2017-12-03 MED ORDER — TRAMADOL HCL 50 MG PO TABS: 50.0000 mg | ORAL_TABLET | Freq: Two times a day (BID) | ORAL | 0 refills | Status: DC | PRN

## 2017-12-03 MED ORDER — BACITRACIN ZINC 500 UNIT/GM EX OINT
TOPICAL_OINTMENT | Freq: Once | CUTANEOUS | Status: AC
  Administered 2017-12-03: 14:00:00 via TOPICAL
  Filled 2017-12-03: qty 0.9

## 2017-12-03 MED ORDER — TETANUS-DIPHTH-ACELL PERTUSSIS 5-2.5-18.5 LF-MCG/0.5 IM SUSP
0.5000 mL | Freq: Once | INTRAMUSCULAR | Status: AC
  Administered 2017-12-03: 0.5 mL via INTRAMUSCULAR
  Filled 2017-12-03: qty 0.5

## 2017-12-03 MED ORDER — LIDOCAINE HCL (PF) 1 % IJ SOLN
INTRAMUSCULAR | Status: AC
  Administered 2017-12-03: 5 mL
  Filled 2017-12-03: qty 5

## 2017-12-03 NOTE — ED Triage Notes (Signed)
Pt arrives from work after injury results in laceration to right arm. Area wrapped with gauze, bleeding contained.

## 2017-12-03 NOTE — ED Provider Notes (Signed)
[facility] Emergency Department Provider Note   ____________________________________________   First MD Initiated Contact with Patient 12/03/17 1302     (approximate)  I have reviewed the triage vital signs and the nursing notes.   HISTORY  Chief Complaint Extremity Laceration    HPI Jack Fields is a 27 y.o. male patient presented with a laceration to the right forearm.  Incident occurred at work.  Area was dressed prior to arrival.  Patient denies loss sensation loss of function.  Patient is right-hand dominant.  Patient tetanus shot is not up-to-date.  Patient rates pain as a 7/10.  Patient described pain is "aching".  History reviewed. No pertinent past medical history.  There are no active problems to display for this patient.   History reviewed. No pertinent surgical history.  Prior to Admission medications   Medication Sig Start Date End Date Taking? Authorizing Provider  cyclobenzaprine (FLEXERIL) 10 MG tablet Take 1 tablet (10 mg total) by mouth 3 (three) times daily as needed. 10/18/17   Joni ReiningSmith, Ronald K, PA-C  cyclobenzaprine (FLEXERIL) 5 MG tablet Take 1-2 tablets 3 times daily as needed 06/22/17   Enid DerryWagner, Ashley, PA-C  famotidine (PEPCID) 20 MG tablet Take 1 tablet (20 mg total) by mouth 2 (two) times daily. 09/19/17   Emily FilbertWilliams, Jonathan E, MD  ibuprofen (ADVIL,MOTRIN) 600 MG tablet Take 1 tablet (600 mg total) by mouth every 8 (eight) hours as needed. 10/18/17   Joni ReiningSmith, Ronald K, PA-C  ibuprofen (ADVIL,MOTRIN) 800 MG tablet Take 1 tablet (800 mg total) by mouth every 8 (eight) hours as needed. 06/22/17   Enid DerryWagner, Ashley, PA-C  ondansetron (ZOFRAN ODT) 4 MG disintegrating tablet Allow 1-2 tablets to dissolve in your mouth every 8 hours as needed for nausea/vomiting 06/04/17   Loleta RoseForbach, Cory, MD  ondansetron (ZOFRAN ODT) 4 MG disintegrating tablet Take 1 tablet (4 mg total) by mouth every 8 (eight) hours as needed for nausea or vomiting. 09/19/17    Emily FilbertWilliams, Jonathan E, MD  oseltamivir (TAMIFLU) 75 MG capsule Take 1 capsule (75 mg total) by mouth 2 (two) times daily. 05/10/17   Fisher, Roselyn BeringSusan W, PA-C  traMADol (ULTRAM) 50 MG tablet Take 1 tablet (50 mg total) by mouth every 12 (twelve) hours as needed. 10/18/17   Joni ReiningSmith, Ronald K, PA-C  traMADol (ULTRAM) 50 MG tablet Take 1 tablet (50 mg total) by mouth every 12 (twelve) hours as needed. 12/03/17   Joni ReiningSmith, Ronald K, PA-C    Allergies Patient has no known allergies.  No family history on file.  Social History Social History   Tobacco Use  . Smoking status: Current Every Day Smoker  . Smokeless tobacco: Never Used  Substance Use Topics  . Alcohol use: No  . Drug use: No    Review of Systems Constitutional: No fever/chills Eyes: No visual changes. ENT: No sore throat. Cardiovascular: Denies chest pain. Respiratory: Denies shortness of breath. Gastrointestinal: No abdominal pain.  No nausea, no vomiting.  No diarrhea.  No constipation. Genitourinary: Negative for dysuria. Musculoskeletal: Negative for back pain. Skin: Negative for rash.  Right forearm laceration Neurological: Negative for headaches, focal weakness or numbness.   ____________________________________________   PHYSICAL EXAM:  VITAL SIGNS: ED Triage Vitals  Enc Vitals Group     BP 12/03/17 1252 (!) 150/84     Pulse Rate 12/03/17 1252 (!) 102     Resp 12/03/17 1252 18     Temp 12/03/17 1252 98.2 F (36.8 C)     Temp  Source 12/03/17 1252 Oral     SpO2 12/03/17 1252 100 %     Weight 12/03/17 1253 175 lb (79.4 kg)     Height 12/03/17 1253 6' (1.829 m)     Head Circumference --      Peak Flow --      Pain Score 12/03/17 1252 7     Pain Loc --      Pain Edu? --      Excl. in GC? --    Constitutional: Alert and oriented. Well appearing and in no acute distress. Cardiovascular: Normal rate, regular rhythm. Grossly normal heart sounds.  Good peripheral circulation.  Elevated blood pressure. Respiratory:  Normal respiratory effort.  No retractions. Lungs CTAB. Skin: 1 cm laceration right proximal forearm.   Psychiatric: Mood and affect are normal. Speech and behavior are normal.  ____________________________________________   LABS (all labs ordered are listed, but only abnormal results are displayed)  Labs Reviewed - No data to display ____________________________________________  EKG   ____________________________________________  RADIOLOGY  ED MD interpretation:    Official radiology report(s): No results found.  ____________________________________________   PROCEDURES  Procedure(s) performed: None  .Marland KitchenLaceration Repair Date/Time: 12/03/2017 1:53 PM Performed by: Joni Reining, PA-C Authorized by: Joni Reining, PA-C   Consent:    Consent obtained:  Verbal   Consent given by:  Patient   Risks discussed:  Infection and poor cosmetic result Anesthesia (see MAR for exact dosages):    Anesthesia method:  Local infiltration   Local anesthetic:  Lidocaine 1% w/o epi Laceration details:    Location:  Shoulder/arm   Shoulder/arm location:  R lower arm   Length (cm):  4 Repair type:    Repair type:  Simple Pre-procedure details:    Preparation:  Patient was prepped and draped in usual sterile fashion Exploration:    Hemostasis achieved with:  Direct pressure Treatment:    Area cleansed with:  Betadine and saline   Amount of cleaning:  Standard   Visualized foreign bodies/material removed: no   Skin repair:    Repair method:  Sutures and tissue adhesive   Suture size:  3-0   Suture technique:  Simple interrupted   Number of sutures:  6 Approximation:    Approximation:  Close Post-procedure details:    Dressing:  Antibiotic ointment and non-adherent dressing   Patient tolerance of procedure:  Tolerated well, no immediate complications    Critical Care performed: No  ____________________________________________   INITIAL IMPRESSION / ASSESSMENT AND  PLAN / ED COURSE  As part of my medical decision making, I reviewed the following data within the electronic MEDICAL RECORD NUMBER    Patient presented right forearm laceration.  Area was surgical clean and closed with sutures.  Patient given a tetanus booster.  Patient given discharge care instruction and prescription for tramadol.  Patient advised return back in 10 days for suture removal.  Return earlier if wound reopens for healing is complete.      ____________________________________________   FINAL CLINICAL IMPRESSION(S) / ED DIAGNOSES  Final diagnoses:  Forearm laceration, right, initial encounter     ED Discharge Orders        Ordered    traMADol (ULTRAM) 50 MG tablet  Every 12 hours PRN     12/03/17 1349       Note:  This document was prepared using Dragon voice recognition software and may include unintentional dictation errors.    Joni Reining, PA-C 12/03/17 1355  Myrna Blazer, MD 12/03/17 [phone]

## 2018-04-02 ENCOUNTER — Encounter: Payer: Self-pay | Admitting: Emergency Medicine

## 2018-04-02 ENCOUNTER — Emergency Department
Admission: EM | Admit: 2018-04-02 | Discharge: 2018-04-02 | Disposition: A | Discharge status: Home / Self Care | Payer: Self-pay | Attending: Emergency Medicine | Admitting: Emergency Medicine

## 2018-04-02 ENCOUNTER — Other Ambulatory Visit: Payer: Self-pay

## 2018-04-02 ENCOUNTER — Emergency Department: Payer: Self-pay

## 2018-04-02 DIAGNOSIS — R112 Nausea with vomiting, unspecified: Secondary | ICD-10-CM | POA: Insufficient documentation

## 2018-04-02 DIAGNOSIS — R109 Unspecified abdominal pain: Secondary | ICD-10-CM | POA: Insufficient documentation

## 2018-04-02 DIAGNOSIS — F430 Acute stress reaction: Secondary | ICD-10-CM | POA: Insufficient documentation

## 2018-04-02 DIAGNOSIS — F4329 Adjustment disorder with other symptoms: Secondary | ICD-10-CM

## 2018-04-02 DIAGNOSIS — F17200 Nicotine dependence, unspecified, uncomplicated: Secondary | ICD-10-CM | POA: Insufficient documentation

## 2018-04-02 LAB — CBC WITH DIFFERENTIAL/PLATELET
Abs Immature Granulocytes: 0.01 10*3/uL (ref 0.00–0.07)
BASOS ABS: 0 10*3/uL (ref 0.0–0.1)
Basophils Relative: 1 %
EOS PCT: 2 %
Eosinophils Absolute: 0.1 10*3/uL (ref 0.0–0.5)
HCT: 45.7 % (ref 39.0–52.0)
HEMOGLOBIN: 15.1 g/dL (ref 13.0–17.0)
Immature Granulocytes: 0 %
LYMPHS PCT: 23 %
Lymphs Abs: 1.2 10*3/uL (ref 0.7–4.0)
MCH: 30.6 pg (ref 26.0–34.0)
MCHC: 33 g/dL (ref 30.0–36.0)
MCV: 92.5 fL (ref 80.0–100.0)
Monocytes Absolute: 0.4 10*3/uL (ref 0.1–1.0)
Monocytes Relative: 8 %
NEUTROS PCT: 66 %
NRBC: 0 % (ref 0.0–0.2)
Neutro Abs: 3.5 10*3/uL (ref 1.7–7.7)
Platelets: 207 10*3/uL (ref 150–400)
RBC: 4.94 MIL/uL (ref 4.22–5.81)
RDW: 12.7 % (ref 11.5–15.5)
WBC: 5.3 10*3/uL (ref 4.0–10.5)

## 2018-04-02 LAB — COMPREHENSIVE METABOLIC PANEL
ALBUMIN: 4.6 g/dL (ref 3.5–5.0)
ALK PHOS: 73 U/L (ref 38–126)
ALT: 22 U/L (ref 0–44)
ANION GAP: 9 (ref 5–15)
AST: 22 U/L (ref 15–41)
BUN: 19 mg/dL (ref 6–20)
CO2: 25 mmol/L (ref 22–32)
Calcium: 9.4 mg/dL (ref 8.9–10.3)
Chloride: 106 mmol/L (ref 98–111)
Creatinine, Ser: 1.06 mg/dL (ref 0.61–1.24)
GFR calc Af Amer: >60 mL/min (ref >60)
GFR calc non Af Amer: >60 mL/min (ref >60)
GLUCOSE: 98 mg/dL (ref 70–99)
POTASSIUM: 4.1 mmol/L (ref 3.5–5.1)
SODIUM: 140 mmol/L (ref 135–145)
Total Bilirubin: 0.7 mg/dL (ref 0.3–1.2)
Total Protein: 7.5 g/dL (ref 6.5–8.1)

## 2018-04-02 LAB — URINALYSIS, COMPLETE (UACMP) WITH MICROSCOPIC
BACTERIA UA: NONE SEEN
Bilirubin Urine: NEGATIVE
Glucose, UA: NEGATIVE mg/dL
HGB URINE DIPSTICK: NEGATIVE
Ketones, ur: NEGATIVE mg/dL
Leukocytes, UA: NEGATIVE
Nitrite: NEGATIVE
Protein, ur: NEGATIVE mg/dL
Specific Gravity, Urine: 1.02 (ref 1.005–1.030)
Squamous Epithelial / LPF: NONE SEEN (ref 0–5)
pH: 5 (ref 5.0–8.0)

## 2018-04-02 LAB — LIPASE, BLOOD: Lipase: 28 U/L (ref 11–51)

## 2018-04-02 LAB — TROPONIN I: Troponin I: <0.03 ng/mL (ref <0.03)

## 2018-04-02 MED ORDER — DIAZEPAM 5 MG PO TABS: 5.0000 mg | ORAL_TABLET | Freq: Three times a day (TID) | ORAL | 0 refills | Status: DC | PRN

## 2018-04-02 MED ORDER — DIAZEPAM 5 MG PO TABS
5.0000 mg | ORAL_TABLET | Freq: Once | ORAL | Status: AC
  Administered 2018-04-02: 5 mg via ORAL
  Filled 2018-04-02: qty 1

## 2018-04-02 MED ORDER — ONDANSETRON 4 MG PO TBDP: 4.0000 mg | ORAL_TABLET | Freq: Three times a day (TID) | ORAL | 0 refills | Status: DC | PRN

## 2018-04-02 MED ORDER — FAMOTIDINE 20 MG PO TABS: 20.0000 mg | ORAL_TABLET | Freq: Two times a day (BID) | ORAL | 1 refills | Status: DC

## 2018-04-02 MED ORDER — FAMOTIDINE 20 MG PO TABS
20.0000 mg | ORAL_TABLET | Freq: Once | ORAL | Status: AC
  Administered 2018-04-02: 20 mg via ORAL
  Filled 2018-04-02: qty 1

## 2018-04-02 NOTE — ED Provider Notes (Addendum)
[facility] Emergency Department Provider Note       Time seen: ----------------------------------------- 8:30 AM on 04/02/2018 -----------------------------------------   I have reviewed the triage vital signs and the nursing notes.  HISTORY   Chief Complaint Emesis and Abdominal Pain    HPI Jack Fields is a 27 y.o. male with no significant past medical history who presents to the ED for vomiting and epigastric pain that started yesterday.  Patient reports today he had noticed blood in his vomit and the pain moved up on the left side of his chest.  Patient is also very upset because his father died 3 months ago, his brother was killed 1 month ago and he just found his mom has cancer.  He has been tearful.  History reviewed. No pertinent past medical history.  There are no active problems to display for this patient.   History reviewed. No pertinent surgical history.  Allergies Patient has no known allergies.  Social History Social History   Tobacco Use  . Smoking status: Current Every Day Smoker  . Smokeless tobacco: Never Used  Substance Use Topics  . Alcohol use: No  . Drug use: No   Review of Systems Constitutional: Negative for fever. Cardiovascular: Positive for chest pain Respiratory: Negative for shortness of breath. Gastrointestinal: Negative for abdominal pain, positive for vomiting Musculoskeletal: Negative for back pain. Skin: Negative for rash. Neurological: Negative for headaches, focal weakness or numbness.  All systems negative/normal/unremarkable except as stated in the HPI  ____________________________________________   PHYSICAL EXAM:  VITAL SIGNS: ED Triage Vitals [04/02/18 0826]  Enc Vitals Group     BP (!) 156/91     Pulse Rate 92     Resp 16     Temp 98.2 F (36.8 C)     Temp Source Oral     SpO2 98 %     Weight 190 lb (86.2 kg)     Height 5\' 10"  (1.778 m)     Head Circumference      Peak Flow       Pain Score 5     Pain Loc      Pain Edu?      Excl. in GC?    Constitutional: Alert and oriented. Well appearing and in no distress. Eyes: Conjunctivae are normal. Normal extraocular movements. ENT   Head: Normocephalic and atraumatic.   Nose: No congestion/rhinnorhea.   Mouth/Throat: Mucous membranes are moist.   Neck: No stridor. Cardiovascular: Normal rate, regular rhythm. No murmurs, rubs, or gallops. Respiratory: Normal respiratory effort without tachypnea nor retractions. Breath sounds are clear and equal bilaterally. No wheezes/rales/rhonchi. Gastrointestinal: Soft and nontender. Normal bowel sounds Musculoskeletal: Nontender with normal range of motion in extremities. No lower extremity tenderness nor edema. Neurologic:  Normal speech and language. No gross focal neurologic deficits are appreciated.  Skin:  Skin is warm, dry and intact. No rash noted. Psychiatric: Tearful ____________________________________________  EKG: Interpreted by me.  Sinus rhythm the rate of 78 bpm, normal PR interval, normal QRS, normal QT  ____________________________________________  ED COURSE:  As part of my medical decision making, I reviewed the following data within the electronic MEDICAL RECORD NUMBER History obtained from family if available, nursing notes, old chart and ekg, as well as notes from prior ED visits. Patient presented for chest pain with recent vomiting and significant stress, we will assess with labs and imaging as indicated at this time.   Procedures ____________________________________________   LABS (pertinent positives/negatives)  Labs Reviewed  URINALYSIS, COMPLETE (UACMP) WITH MICROSCOPIC - Abnormal; Notable for the following components:      Result Value   Color, Urine YELLOW (*)    APPearance CLEAR (*)    All other components within normal limits  CBC WITH DIFFERENTIAL/PLATELET  COMPREHENSIVE METABOLIC PANEL  LIPASE, BLOOD  TROPONIN I     RADIOLOGY Images were viewed by me  Chest x-ray Is unremarkable ____________________________________________  DIFFERENTIAL DIAGNOSIS   Mallory-Weiss tear, GERD, gastroenteritis, gastritis, acute grief reaction, depression, anxiety, musculoskeletal pain, unstable angina unlikely  FINAL ASSESSMENT AND PLAN  Vomiting, stress   Plan: The patient had presented for vomiting which is likely secondary to stress. Patient's labs were reassuring. Patient's imaging is also reassuring.  He will be discharged with anxiolytics to take as needed.  I have encouraged him to get counseling as well due to the significant stress that he is under.  We will also encourage Pepcid as an antacid.   Ulice DashJohnathan E Mark Benecke, MD   Note: This note was generated in part or whole with voice recognition software. Voice recognition is usually quite accurate but there are transcription errors that can and very often do occur. I apologize for any typographical errors that were not detected and corrected.     Emily FilbertWilliams, Mackson Botz E, MD 04/02/18 [id number]    Emily FilbertWilliams, Brynnlie Unterreiner E, MD 04/02/18 1051

## 2018-04-02 NOTE — ED Notes (Signed)
First Nurse Note: Patient remains in BR, male Registration Clerk went into BR to check on patient.  Patient walks out.

## 2018-04-02 NOTE — ED Notes (Signed)
First Nurse Note: Patient ambulatory to registration, color good.  States he has been vomiting since yesterday and has noticed some blood.  After registration, patient went to BR.

## 2018-04-02 NOTE — ED Triage Notes (Signed)
Patient reports vomiting and epigastric pain that started yesterday. Reports today he has noticed blood in his vomit and pain has moved up left side of chest. Patient also states that his mom told him yesterday that she had cancer and now he is scared that's what he does.

## 2018-04-02 NOTE — ED Notes (Signed)
Upon discharge, this nurse asked the patient if he had someone to pick him up as he had been given diazepam at [phone] which impaired his ability to drive. The patient stated "I'm fine, I live just down the road". This nurse explained that being under the influence of diazepam was no different than drinking alcohol when it came to driving and that we could not discharge him until he had someone come to pick him up. The patient stated "I'm not staying here, I'm going out to my car, I don't need those papers". At that time, this nurse asked the patient to stay for just a few moments so that he could be provided a cab voucher. Pt declined the cab voucher and restated his intention to go out to his car. EDP and charge nurse were notified of the situation. This nurse cautioned the patient one final time and the patient restated his intent to go to his car. The patient did agree to sign the discharge but then left the room and proceeded to the lobby. [place] [employer] was notified NT Pam.

## 2018-10-15 ENCOUNTER — Other Ambulatory Visit: Admission: RE | Admit: 2018-10-15 | Payer: Self-pay | Source: Ambulatory Visit | Admitting: Family Medicine

## 2018-10-15 ENCOUNTER — Other Ambulatory Visit: Payer: Self-pay

## 2018-10-15 ENCOUNTER — Other Ambulatory Visit
Admission: RE | Admit: 2018-10-15 | Discharge: 2018-10-15 | Disposition: A | Discharge status: Home / Self Care | Payer: Self-pay | Source: Ambulatory Visit | Attending: Family Medicine | Admitting: Family Medicine

## 2018-10-15 DIAGNOSIS — B356 Tinea cruris: Secondary | ICD-10-CM | POA: Diagnosis not present

## 2018-10-15 DIAGNOSIS — Z79899 Other long term (current) drug therapy: Secondary | ICD-10-CM | POA: Insufficient documentation

## 2018-10-15 DIAGNOSIS — F17210 Nicotine dependence, cigarettes, uncomplicated: Secondary | ICD-10-CM | POA: Diagnosis not present

## 2018-10-15 LAB — URINE DRUG SCREEN, QUALITATIVE (ARMC ONLY)
Amphetamines, Ur Screen: NOT DETECTED
Barbiturates, Ur Screen: NOT DETECTED
Benzodiazepine, Ur Scrn: POSITIVE — AB
Cannabinoid 50 Ng, Ur ~~LOC~~: POSITIVE — AB
Cocaine Metabolite,Ur ~~LOC~~: NOT DETECTED
MDMA (Ecstasy)Ur Screen: NOT DETECTED
Methadone Scn, Ur: NOT DETECTED
Opiate, Ur Screen: POSITIVE — AB
Phencyclidine (PCP) Ur S: NOT DETECTED
Tricyclic, Ur Screen: NOT DETECTED

## 2018-10-15 LAB — BASIC METABOLIC PANEL
Anion gap: 8 (ref 5–15)
BUN: 13 mg/dL (ref 6–20)
CO2: 24 mmol/L (ref 22–32)
Calcium: 9 mg/dL (ref 8.9–10.3)
Chloride: 107 mmol/L (ref 98–111)
Creatinine, Ser: 1.14 mg/dL (ref 0.61–1.24)
GFR calc Af Amer: >60 mL/min (ref >60)
GFR calc non Af Amer: >60 mL/min (ref >60)
Glucose, Bld: 105 mg/dL — ABNORMAL HIGH (ref 70–99)
Potassium: 3.5 mmol/L (ref 3.5–5.1)
Sodium: 139 mmol/L (ref 135–145)

## 2018-10-15 LAB — CBC
HCT: 41.8 % (ref 39.0–52.0)
Hemoglobin: 14.1 g/dL (ref 13.0–17.0)
MCH: 30.2 pg (ref 26.0–34.0)
MCHC: 33.7 g/dL (ref 30.0–36.0)
MCV: 89.5 fL (ref 80.0–100.0)
Platelets: 218 10*3/uL (ref 150–400)
RBC: 4.67 MIL/uL (ref 4.22–5.81)
RDW: 12.3 % (ref 11.5–15.5)
WBC: 6.2 10*3/uL (ref 4.0–10.5)
nRBC: 0 % (ref 0.0–0.2)

## 2018-10-15 LAB — ETHANOL: Alcohol, Ethyl (B): <10 mg/dL (ref <10)

## 2018-10-15 NOTE — ED Triage Notes (Addendum)
Pt here in police custody and needs medical clearance due to possible drug use.  Pt was found by police asleep in car on the curb.   Possibly used oxycodone and cocaine which were found in the car. Pt admitted to using marijuana earlier today as well as percocet but denies cocaine use.  Pt is alert at this time and answering questions.

## 2018-10-15 NOTE — Progress Notes (Signed)
Pt arrived in police custody of [employer] officer.  Consent obtained for forensic blood draw.  Site prepped with Iodine, puncture site located in right antecubital.  Blood collected and given to officer. Pt tolerated well.

## 2018-10-15 NOTE — ED Triage Notes (Signed)
Formatting of this note might be different from the original.  Pt here in police custody and needs medical clearance due to possible drug use.  Pt was found by police asleep in car on the curb.     Possibly used oxycodone and cocaine which were found in the car. Pt admitted to using marijuana earlier today as well as percocet but denies cocaine use.    Pt is alert at this time and answering questions.   Electronically signed by Richelle Ito, RN at 10/15/2018 10:52 PM EDT

## 2018-10-16 ENCOUNTER — Emergency Department
Admission: EM | Admit: 2018-10-16 | Discharge: 2018-10-16 | Disposition: A | Discharge status: Home / Self Care | Payer: Self-pay | Attending: Emergency Medicine | Admitting: Emergency Medicine

## 2018-10-16 DIAGNOSIS — Z008 Encounter for other general examination: Secondary | ICD-10-CM

## 2018-10-16 DIAGNOSIS — B356 Tinea cruris: Secondary | ICD-10-CM

## 2018-10-16 MED ORDER — TERBINAFINE HCL 1 % EX CREA: 1.0000 "application " | TOPICAL_CREAM | Freq: Every day | CUTANEOUS | 0 refills | Status: AC

## 2018-10-16 MED ORDER — TERBINAFINE HCL 1 % EX CREA: 1.0000 "application " | TOPICAL_CREAM | Freq: Every day | CUTANEOUS | 0 refills | Status: DC

## 2018-10-16 NOTE — ED Notes (Signed)
Discharge inst to pt in triage.  Pt with [employer] officer and handcuffed.  Pt alert, calm and cooperative.

## 2018-10-16 NOTE — ED Provider Notes (Signed)
[facility] Emergency Department Provider Note   None    (approximate)  I have reviewed the triage vital signs and the nursing notes.   HISTORY  Chief Complaint Medical Clearance    HPI Jack Fields is a 28 y.o. male presents to the emergency department secondary to need for medical clearance for incarceration.  Patient presents in police custody with history of being found asleep in his car.  No damage noted to the car.  Patient states that he did indeed take a half a tablet of Xanax and some NyQuil and that he was in very close proximity to his house.  Patient states that he took those medication secondary to difficulty sleeping due to multiple life stressors.  Patient denies any SI or HI.  Patient denies any complaints        No past medical history on file.  There are no active problems to display for this patient.   No past surgical history on file.  Prior to Admission medications   Medication Sig Start Date End Date Taking? Authorizing Provider  cyclobenzaprine (FLEXERIL) 10 MG tablet Take 1 tablet (10 mg total) by mouth 3 (three) times daily as needed. Patient not taking: Reported on 04/02/2018 10/18/17   Joni ReiningSmith, Ronald K, PA-C  diazepam (VALIUM) 5 MG tablet Take 1 tablet (5 mg total) by mouth every 8 (eight) hours as needed for muscle spasms. 04/02/18   Emily FilbertWilliams, Jonathan E, MD  famotidine (PEPCID) 20 MG tablet Take 1 tablet (20 mg total) by mouth 2 (two) times daily. 04/02/18   Emily FilbertWilliams, Jonathan E, MD  ibuprofen (ADVIL,MOTRIN) 600 MG tablet Take 1 tablet (600 mg total) by mouth every 8 (eight) hours as needed. Patient not taking: Reported on 04/02/2018 10/18/17   Joni ReiningSmith, Ronald K, PA-C  ondansetron (ZOFRAN ODT) 4 MG disintegrating tablet Take 1 tablet (4 mg total) by mouth every 8 (eight) hours as needed for nausea or vomiting. 04/02/18   Emily FilbertWilliams, Jonathan E, MD  terbinafine (LAMISIL) 1 % cream Apply 1 application topically daily for 7  days. 10/16/18 10/23/18  Darci CurrentBrown, Ladora N, MD    Allergies Patient has no known allergies.  No family history on file.  Social History Social History   Tobacco Use  . Smoking status: Current Every Day Smoker  . Smokeless tobacco: Never Used  Substance Use Topics  . Alcohol use: No  . Drug use: No    Review of Systems Constitutional: No fever/chills Eyes: No visual changes. ENT: No sore throat. Cardiovascular: Denies chest pain. Respiratory: Denies shortness of breath. Gastrointestinal: No abdominal pain.  No nausea, no vomiting.  No diarrhea.  No constipation. Genitourinary: Negative for dysuria. Musculoskeletal: Negative for neck pain.  Negative for back pain. Integumentary: Negative for rash. Neurological: Negative for headaches, focal weakness or numbness. Psychiatric:  Positive for reported possible overdose   ____________________________________________   PHYSICAL EXAM:  VITAL SIGNS: ED Triage Vitals  Enc Vitals Group     BP 10/15/18 2249 (!) 147/88     Pulse Rate 10/15/18 2249 100     Resp 10/15/18 2249 18     Temp 10/15/18 2249 98.4 F (36.9 C)     Temp Source 10/15/18 2249 Oral     SpO2 10/15/18 2249 99 %     Weight 10/15/18 2250 90.7 kg (200 lb)     Height 10/15/18 2250 1.829 m (6')     Head Circumference --      Peak Flow --  Pain Score 10/15/18 2249 0     Pain Loc --      Pain Edu? --      Excl. in [place]? --     Constitutional: Alert and oriented. Well appearing and in no acute distress. Eyes: Conjunctivae are normal. PERRL. EOMI. Head: Atraumatic. Mouth/Throat: Mucous membranes are moist.  Oropharynx non-erythematous. Neck: No stridor.   Cardiovascular: Normal rate, regular rhythm. Good peripheral circulation. Grossly normal heart sounds. Respiratory: Normal respiratory effort.  No retractions. No audible wheezing. Gastrointestinal: Soft and nontender. No distention.  Musculoskeletal: No lower extremity tenderness nor edema. No gross  deformities of extremities. Neurologic:  Normal speech and language. No gross focal neurologic deficits are appreciated.  Skin:  Skin is warm, dry and intact. No rash noted. Psychiatric: Mood and affect are normal. Speech and behavior are normal.  ____________________________________________   LABS (all labs ordered are listed, but only abnormal results are displayed)  Labs Reviewed  BASIC METABOLIC PANEL - Abnormal; Notable for the following components:      Result Value   Glucose, Bld 105 (*)    All other components within normal limits  URINE DRUG SCREEN, QUALITATIVE ([facility] ONLY) - Abnormal; Notable for the following components:   Opiate, Ur Screen POSITIVE (*)    Cannabinoid 50 Ng, Ur [place] POSITIVE (*)    Benzodiazepine, Ur Scrn POSITIVE (*)    All other components within normal limits  ETHANOL  CBC      Procedures   ____________________________________________   INITIAL IMPRESSION / MDM / ASSESSMENT AND PLAN / ED COURSE  As part of my medical decision making, I reviewed the following data within the electronic MEDICAL RECORD NUMBER   28 year old male presenting with above-stated history and physical exam secondary to request for medical clearance for jail.  Patient medically cleared for incarceration at this time.  *Justinian Miano was evaluated in Emergency Department on 10/16/2018 for the symptoms described in the history of present illness. He was evaluated in the context of the global COVID-19 pandemic, which necessitated consideration that the patient might be at risk for infection with the SARS-CoV-2 virus that causes COVID-19. Institutional protocols and algorithms that pertain to the evaluation of patients at risk for COVID-19 are in a state of rapid change based on information released by regulatory bodies including the CDC and federal and state organizations. These policies and algorithms were followed during the patient's care in the ED.  Some ED evaluations and  interventions may be delayed as a result of limited staffing during the pandemic.*    ____________________________________________  FINAL CLINICAL IMPRESSION(S) / ED DIAGNOSES  Final diagnoses:  Medical clearance for incarceration  Jock itch     MEDICATIONS GIVEN DURING THIS VISIT:  Medications - No data to display   ED Discharge Orders         Ordered    terbinafine (LAMISIL) 1 % cream  Daily,   Status:  Discontinued     10/16/18 0046    terbinafine (LAMISIL) 1 % cream  Daily     10/16/18 0047           Note:  This document was prepared using Dragon voice recognition software and may include unintentional dictation errors.   Gregor Hams, MD 10/17/18 Benancio Deeds

## 2018-10-16 NOTE — ED Notes (Signed)
Formatting of this note might be different from the original.  Discharge inst to pt in triage.  Pt with bpd officer and handcuffed.  Pt alert, calm and cooperative.   Electronically signed by Teresa Pelton, RN at 10/16/2018  1:10 AM EDT

## 2018-10-16 NOTE — ED Provider Notes (Signed)
Formatting of this note is different from the original.  Images from the original note were not included.      [facility]  Emergency Department Provider Note     None     (approximate)    I have reviewed the triage vital signs and the nursing notes.    HISTORY    Chief Complaint  Medical Clearance    HPI  Terry Singleton is a 28 y.o. male presents to the emergency department secondary to need for medical clearance for incarceration.  Patient presents in police custody with history of being found asleep in his car.  No damage noted to the car.  Patient states that he did indeed take a half a tablet of Xanax and some NyQuil and that he was in very close proximity to his house.  Patient states that he took those medication secondary to difficulty sleeping due to multiple life stressors.  Patient denies any SI or HI.  Patient denies any complaints      No past medical history on file.    There are no active problems to display for this patient.    No past surgical history on file.    Prior to Admission medications    Medication Sig Start Date End Date Taking? Authorizing Provider   cyclobenzaprine (FLEXERIL) 10 MG tablet Take 1 tablet (10 mg total) by mouth 3 (three) times daily as needed.  Patient not taking: Reported on 04/02/2018 10/18/17   Sable Feil, PA-C   diazepam (VALIUM) 5 MG tablet Take 1 tablet (5 mg total) by mouth every 8 (eight) hours as needed for muscle spasms. 04/02/18   Earleen Newport, MD   famotidine (PEPCID) 20 MG tablet Take 1 tablet (20 mg total) by mouth 2 (two) times daily. 04/02/18   Earleen Newport, MD   ibuprofen (ADVIL,MOTRIN) 600 MG tablet Take 1 tablet (600 mg total) by mouth every 8 (eight) hours as needed.  Patient not taking: Reported on 04/02/2018 10/18/17   Sable Feil, PA-C   ondansetron (ZOFRAN ODT) 4 MG disintegrating tablet Take 1 tablet (4 mg total) by mouth every 8 (eight) hours as needed for nausea or vomiting. 04/02/18   Earleen Newport, MD   terbinafine (LAMISIL) 1 % cream Apply 1 application topically daily for 7 days. 10/16/18 10/23/18  Gregor Hams, MD     Allergies  Patient has no known allergies.    No family history on file.    Social History  Social History     Tobacco Use   ? Smoking status: Current Every Day Smoker   ? Smokeless tobacco: Never Used   Substance Use Topics   ? Alcohol use: No   ? Drug use: No     Review of Systems  Constitutional: No fever/chills  Eyes: No visual changes.  ENT: No sore throat.  Cardiovascular: Denies chest pain.  Respiratory: Denies shortness of breath.  Gastrointestinal: No abdominal pain.  No nausea, no vomiting.  No diarrhea.  No constipation.  Genitourinary: Negative for dysuria.  Musculoskeletal: Negative for neck pain.  Negative for back pain.  Integumentary: Negative for rash.  Neurological: Negative for headaches, focal weakness or numbness.  Psychiatric:  Positive for reported possible overdose    ____________________________________________    PHYSICAL EXAM:    VITAL SIGNS:  ED Triage Vitals   Enc Vitals Group      BP 10/15/18 2249 (!) 147/88      Pulse Rate  10/15/18 2249 100      Resp 10/15/18 2249 18      Temp 10/15/18 2249 98.4 F (36.9 C)      Temp Source 10/15/18 2249 Oral      SpO2 10/15/18 2249 99 %      Weight 10/15/18 2250 90.7 kg (200 lb)      Height 10/15/18 2250 1.829 m (6')      Head Circumference --       Peak Flow --       Pain Score 10/15/18 2249 0      Pain Loc --       Pain Edu? --       Excl. in [place]? --      Constitutional: Alert and oriented. Well appearing and in no acute distress.  Eyes: Conjunctivae are normal. PERRL. EOMI.  Head: Atraumatic.  Mouth/Throat: Mucous membranes are moist.  Oropharynx non-erythematous.  Neck: No stridor.    Cardiovascular: Normal rate, regular rhythm. Good peripheral circulation. Grossly normal heart sounds.  Respiratory: Normal respiratory effort.  No retractions. No audible wheezing.  Gastrointestinal: Soft and nontender. No  distention.   Musculoskeletal: No lower extremity tenderness nor edema. No gross deformities of extremities.  Neurologic:  Normal speech and language. No gross focal neurologic deficits are appreciated.   Skin:  Skin is warm, dry and intact. No rash noted.  Psychiatric: Mood and affect are normal. Speech and behavior are normal.    ____________________________________________    LABS  (all labs ordered are listed, but only abnormal results are displayed)    Labs Reviewed   BASIC METABOLIC PANEL - Abnormal; Notable for the following components:       Result Value    Glucose, Bld 105 (*)     All other components within normal limits   URINE DRUG SCREEN, QUALITATIVE ([facility] ONLY) - Abnormal; Notable for the following components:    Opiate, Ur Screen POSITIVE (*)     Cannabinoid 50 Ng, Ur [place] POSITIVE (*)     Benzodiazepine, Ur Scrn POSITIVE (*)     All other components within normal limits   ETHANOL   CBC     Procedures    ____________________________________________    INITIAL IMPRESSION / MDM / ASSESSMENT AND PLAN / ED COURSE    As part of my medical decision making, I reviewed the following data within the electronic MEDICAL RECORD NUMBER     28 year old male presenting with above-stated history and physical exam secondary to request for medical clearance for jail.  Patient medically cleared for incarceration at this time.    *Rebeca AllegraChristopher Friese was evaluated in Emergency Department on 10/16/2018 for the symptoms described in the history of present illness. He was evaluated in the context of the global COVID-19 pandemic, which necessitated consideration that the patient might be at risk for infection with the SARS-CoV-2 virus that causes COVID-19. Institutional protocols and algorithms that pertain to the evaluation of patients at risk for COVID-19 are in a state of rapid change based on information released by regulatory bodies including the CDC and federal and state organizations. These policies and algorithms were  followed during the patient's care in the ED.  Some ED evaluations and interventions may be delayed as a result of limited staffing during the pandemic.*    ____________________________________________    FINAL CLINICAL IMPRESSION(S) / ED DIAGNOSES    Final diagnoses:   Medical clearance for incarceration   Jock itch     MEDICATIONS GIVEN DURING THIS  VISIT:    Medications - No data to display    ED Discharge Orders          Ordered     terbinafine (LAMISIL) 1 % cream  Daily,   Status:  Discontinued      10/16/18 0046     terbinafine (LAMISIL) 1 % cream  Daily      10/16/18 0047           Note:  This document was prepared using Dragon voice recognition software and may include unintentional dictation errors.    Darci CurrentBrown, Randolph N, MD  10/17/18 0022    Electronically signed by Darci CurrentBrown, Randolph N, MD at 10/17/2018 12:22 AM EDT

## 2018-12-06 NOTE — Addendum Note (Signed)
Addended by: Toni Arthurs on: 12/15/2018 12:24 PM     Modules accepted: [id number]      Electronically signed by Toni Arthurs, NP at 12/15/2018 12:24 PM EDT

## 2018-12-06 NOTE — Progress Notes (Signed)
Formatting of this note might be different from the original.    Patient was not reached. Certified letter will be sent to patient.   Electronically signed by Toni Arthurs, NP at 12/15/2018 12:24 PM EDT

## 2018-12-06 NOTE — Progress Notes (Signed)
Formatting of this note might be different from the original.    Self Swab Type: Mid Turbinate  Electronically signed by Rodena Medin at 12/06/2018  3:28 PM EDT

## 2019-09-24 ENCOUNTER — Emergency Department: Payer: Self-pay

## 2019-09-24 ENCOUNTER — Other Ambulatory Visit: Payer: Self-pay

## 2019-09-24 ENCOUNTER — Emergency Department
Admission: EM | Admit: 2019-09-24 | Discharge: 2019-09-24 | Disposition: A | Discharge status: Home / Self Care | Payer: Self-pay | Attending: Emergency Medicine | Admitting: Emergency Medicine

## 2019-09-24 DIAGNOSIS — R300 Dysuria: Secondary | ICD-10-CM | POA: Insufficient documentation

## 2019-09-24 DIAGNOSIS — F17200 Nicotine dependence, unspecified, uncomplicated: Secondary | ICD-10-CM | POA: Insufficient documentation

## 2019-09-24 DIAGNOSIS — R350 Frequency of micturition: Secondary | ICD-10-CM | POA: Insufficient documentation

## 2019-09-24 DIAGNOSIS — N200 Calculus of kidney: Secondary | ICD-10-CM | POA: Insufficient documentation

## 2019-09-24 DIAGNOSIS — R319 Hematuria, unspecified: Secondary | ICD-10-CM | POA: Insufficient documentation

## 2019-09-24 LAB — URINALYSIS, COMPLETE (UACMP) WITH MICROSCOPIC
Bilirubin Urine: NEGATIVE
Glucose, UA: NEGATIVE mg/dL
Ketones, ur: NEGATIVE mg/dL
Nitrite: NEGATIVE
Protein, ur: NEGATIVE mg/dL
Specific Gravity, Urine: 1.02 (ref 1.005–1.030)
pH: 8 (ref 5.0–8.0)

## 2019-09-24 MED ORDER — NAPROXEN 500 MG PO TABS: 500.0000 mg | ORAL_TABLET | Freq: Two times a day (BID) | ORAL | 0 refills | Status: DC

## 2019-09-24 MED ORDER — HYDROCODONE-ACETAMINOPHEN 5-325 MG PO TABS: 1.0000 | ORAL_TABLET | Freq: Four times a day (QID) | ORAL | 0 refills | Status: AC | PRN

## 2019-09-24 NOTE — Discharge Instructions (Signed)
You will need to follow-up with Vanna Scotland who is the urologist on call today if you continue to have problems.  Drink lots of fluids however for the time being decrease or discontinue drinking ice tea.  Take naproxen 500 mg twice daily with food for inflammation and to help with pain.  This medication you can take while driving or operating machinery.  This medication would be safe to take at work.  The hydrocodone should be taken when you are at home as this may cause drowsiness and increase your risk for injury.

## 2019-09-24 NOTE — ED Provider Notes (Signed)
[facility] Emergency Department Provider Note  ____________________________________________   First MD Initiated Contact with Patient 09/24/19 1119     (approximate)  I have reviewed the triage vital signs and the nursing notes.   HISTORY  Chief Complaint Urinary Frequency and Back Pain   HPI Jack Fields is a 29 y.o. male resents to the ED with complaint of right-sided flank pain that started 1-1/2 to 2 weeks ago.  Patient states that for the last 4 days he has had some urinary frequency and dysuria.  He denies any injury to his back, no fever, chills, nausea or vomiting.  No previous history of stones however there is a family history.  He rates his pain as an 8 out of 10.       History reviewed. No pertinent past medical history.  There are no problems to display for this patient.   History reviewed. No pertinent surgical history.  Prior to Admission medications   Medication Sig Start Date End Date Taking? Authorizing Provider  HYDROcodone-acetaminophen (NORCO/VICODIN) 5-325 MG tablet Take 1 tablet by mouth every 6 (six) hours as needed for moderate pain. 09/24/19   Johnn Hai, PA-C  naproxen (NAPROSYN) 500 MG tablet Take 1 tablet (500 mg total) by mouth 2 (two) times daily with a meal. 09/24/19   Johnn Hai, PA-C  famotidine (PEPCID) 20 MG tablet Take 1 tablet (20 mg total) by mouth 2 (two) times daily. 04/02/18 09/24/19  Earleen Newport, MD    Allergies Tramadol  History reviewed. No pertinent family history.  Social History Social History   Tobacco Use  . Smoking status: Current Every Day Smoker  . Smokeless tobacco: Never Used  Substance Use Topics  . Alcohol use: No  . Drug use: No    Review of Systems Constitutional: No fever/chills Cardiovascular: Denies chest pain. Respiratory: Denies shortness of breath. Gastrointestinal: No abdominal pain.  No nausea, no vomiting.  Genitourinary: Positive for  dysuria.  Positive for right flank pain. Musculoskeletal: Negative for musculoskeletal pain Skin: Negative for rash. Neurological: Negative for headaches, focal weakness or numbness. ____________________________________________   PHYSICAL EXAM:  VITAL SIGNS: ED Triage Vitals  Enc Vitals Group     BP 09/24/19 1014 128/86     Pulse Rate 09/24/19 1014 79     Resp 09/24/19 1014 18     Temp 09/24/19 1014 98.8 F (37.1 C)     Temp Source 09/24/19 1014 Oral     SpO2 09/24/19 1014 99 %     Weight 09/24/19 1015 185 lb (83.9 kg)     Height 09/24/19 1015 5\' 9"  (1.753 m)     Head Circumference --      Peak Flow --      Pain Score 09/24/19 1014 8     Pain Loc --      Pain Edu? --      Excl. in [place]? --     Constitutional: Alert and oriented. Well appearing and in no acute distress. Eyes: Conjunctivae are normal.  Head: Atraumatic. Neck: No stridor.   Cardiovascular: Normal rate, regular rhythm. Grossly normal heart sounds.  Good peripheral circulation. Respiratory: Normal respiratory effort.  No retractions. Lungs CTAB. Gastrointestinal: Soft and nontender. No distention.  Right CVA tenderness with percussion. Musculoskeletal: Moves upper and lower extremities with any difficulty.  Normal gait was noted. Neurologic:  Normal speech and language. No gross focal neurologic deficits are appreciated. No gait instability. Skin:  Skin is warm, dry and  intact.  Psychiatric: Mood and affect are normal. Speech and behavior are normal.  ____________________________________________   LABS (all labs ordered are listed, but only abnormal results are displayed)  Labs Reviewed  URINALYSIS, COMPLETE (UACMP) WITH MICROSCOPIC - Abnormal; Notable for the following components:      Result Value   Color, Urine YELLOW (*)    APPearance CLOUDY (*)    Hgb urine dipstick MODERATE (*)    Leukocytes,Ua TRACE (*)    Bacteria, UA RARE (*)    All other components within normal limits     RADIOLOGY   Official radiology report(s): CT Renal Stone Study  Result Date: 09/24/2019 CLINICAL DATA:  Right flank pain for 3 weeks. Dysuria. Hematuria. EXAM: CT ABDOMEN AND PELVIS WITHOUT CONTRAST TECHNIQUE: Multidetector CT imaging of the abdomen and pelvis was performed following the standard protocol without IV contrast. COMPARISON:  None. FINDINGS: Lower chest: No acute findings. Hepatobiliary: No mass visualized on this unenhanced exam. Gallbladder is unremarkable. No evidence of biliary ductal dilatation. Pancreas: No mass or inflammatory process visualized on this unenhanced exam. Spleen:  Within normal limits in size. Adrenals/Urinary tract: Tiny 1-2 mm punctate calculus is seen in the midpole right kidney. No evidence of ureteral calculi or hydronephrosis. Unremarkable unopacified urinary bladder. Stomach/Bowel: No evidence of obstruction, inflammatory process, or abnormal fluid collections. Normal appendix visualized. Vascular/Lymphatic: No pathologically enlarged lymph nodes identified. No evidence of abdominal aortic aneurysm. Reproductive:  No mass or other significant abnormality. Other:  None. Musculoskeletal:  No suspicious bone lesions identified. IMPRESSION: Tiny punctate right renal calculus. No evidence of ureteral calculi, hydronephrosis, or other acute findings. Electronically Signed   By: Danae Orleans M.D.   On: 09/24/2019 12:21    ____________________________________________   PROCEDURES  Procedure(s) performed (including Critical Care):  Procedures   ____________________________________________   INITIAL IMPRESSION / ASSESSMENT AND PLAN / ED COURSE  As part of my medical decision making, I reviewed the following data within the electronic MEDICAL RECORD NUMBER Notes from prior ED visits and [place] Controlled Substance Database  29 year old male presents to the ED with complaint of right flank pain that started 2 weeks ago and progressed to some dysuria beginning 4  days ago.  Patient denies any gross hematuria but states he has had some frequency along with some pain.  He denies any fever, chills, nausea or vomiting.  He denies any previous history of stones or UTIs.  Physical exam is benign with the exception of some mild tenderness on percussion of the right CVA area.  Urinalysis was positive for RBCs.  CT scan did show a renal calculus in the right kidney.  It is possible that he passed a stone 2 weeks ago as having some renal colic.  Patient was made aware that if his symptoms continue he is going to need to see the urologist on-call and contact information was given to him.  He was given a prescription for hydrocodone and naproxen.  He was also given a note to remain out of work today.  ____________________________________________   FINAL CLINICAL IMPRESSION(S) / ED DIAGNOSES  Final diagnoses:  Renal calculus, right  Hematuria, unspecified type     ED Discharge Orders         Ordered    HYDROcodone-acetaminophen (NORCO/VICODIN) 5-325 MG tablet  Every 6 hours PRN     09/24/19 1258    naproxen (NAPROSYN) 500 MG tablet  2 times daily with meals     09/24/19 1258  Note:  This document was prepared using Dragon voice recognition software and may include unintentional dictation errors.    Tommi Rumps, PA-C 09/24/19 1353    Emily Filbert, MD 09/24/19 1359

## 2019-09-24 NOTE — ED Notes (Signed)
See triage note- pt here with right sided flank pain that started two weeks ago. Has had one incidence of dysuria on Saturday. Denies any fevers.

## 2019-09-24 NOTE — ED Triage Notes (Signed)
Pt arrives via POV for reports of right flank pain x 2-3 weeks with urinary frequency and dysuria since Saturday. Denies blood in urine. C/o headache.

## 2019-10-16 ENCOUNTER — Emergency Department: Payer: Self-pay

## 2019-10-16 ENCOUNTER — Encounter: Payer: Self-pay | Admitting: Emergency Medicine

## 2019-10-16 ENCOUNTER — Emergency Department
Admission: EM | Admit: 2019-10-16 | Discharge: 2019-10-16 | Disposition: A | Discharge status: Home / Self Care | Payer: Self-pay | Attending: Emergency Medicine | Admitting: Emergency Medicine

## 2019-10-16 ENCOUNTER — Other Ambulatory Visit: Payer: Self-pay

## 2019-10-16 DIAGNOSIS — F17200 Nicotine dependence, unspecified, uncomplicated: Secondary | ICD-10-CM | POA: Insufficient documentation

## 2019-10-16 DIAGNOSIS — Y9389 Activity, other specified: Secondary | ICD-10-CM | POA: Insufficient documentation

## 2019-10-16 DIAGNOSIS — R599 Enlarged lymph nodes, unspecified: Secondary | ICD-10-CM | POA: Insufficient documentation

## 2019-10-16 DIAGNOSIS — Y999 Unspecified external cause status: Secondary | ICD-10-CM | POA: Insufficient documentation

## 2019-10-16 DIAGNOSIS — M545 Low back pain: Secondary | ICD-10-CM | POA: Insufficient documentation

## 2019-10-16 DIAGNOSIS — X500XXA Overexertion from strenuous movement or load, initial encounter: Secondary | ICD-10-CM | POA: Insufficient documentation

## 2019-10-16 DIAGNOSIS — Y9289 Other specified places as the place of occurrence of the external cause: Secondary | ICD-10-CM | POA: Insufficient documentation

## 2019-10-16 DIAGNOSIS — R319 Hematuria, unspecified: Secondary | ICD-10-CM | POA: Insufficient documentation

## 2019-10-16 DIAGNOSIS — R109 Unspecified abdominal pain: Secondary | ICD-10-CM | POA: Insufficient documentation

## 2019-10-16 LAB — BASIC METABOLIC PANEL
Anion gap: 8 (ref 5–15)
BUN: 15 mg/dL (ref 6–20)
CO2: 30 mmol/L (ref 22–32)
Calcium: 9.5 mg/dL (ref 8.9–10.3)
Chloride: 101 mmol/L (ref 98–111)
Creatinine, Ser: 1.1 mg/dL (ref 0.61–1.24)
GFR calc Af Amer: >60 mL/min (ref >60)
GFR calc non Af Amer: >60 mL/min (ref >60)
Glucose, Bld: 74 mg/dL (ref 70–99)
Potassium: 4.3 mmol/L (ref 3.5–5.1)
Sodium: 139 mmol/L (ref 135–145)

## 2019-10-16 LAB — CBC
HCT: 45.2 % (ref 39.0–52.0)
Hemoglobin: 15.4 g/dL (ref 13.0–17.0)
MCH: 30.3 pg (ref 26.0–34.0)
MCHC: 34.1 g/dL (ref 30.0–36.0)
MCV: 88.8 fL (ref 80.0–100.0)
Platelets: 243 10*3/uL (ref 150–400)
RBC: 5.09 MIL/uL (ref 4.22–5.81)
RDW: 12.7 % (ref 11.5–15.5)
WBC: 6.3 10*3/uL (ref 4.0–10.5)
nRBC: 0 % (ref 0.0–0.2)

## 2019-10-16 LAB — URINALYSIS, COMPLETE (UACMP) WITH MICROSCOPIC
Bilirubin Urine: NEGATIVE
Glucose, UA: NEGATIVE mg/dL
Ketones, ur: NEGATIVE mg/dL
Nitrite: NEGATIVE
Protein, ur: NEGATIVE mg/dL
Specific Gravity, Urine: 1.021 (ref 1.005–1.030)
pH: 5 (ref 5.0–8.0)

## 2019-10-16 MED ORDER — KETOROLAC TROMETHAMINE 60 MG/2ML IM SOLN
60.0000 mg | Freq: Once | INTRAMUSCULAR | Status: DC
  Filled 2019-10-16: qty 2

## 2019-10-16 MED ORDER — NAPROXEN 500 MG PO TABS
500.0000 mg | ORAL_TABLET | Freq: Two times a day (BID) | ORAL | 0 refills | Status: AC
Start: 2019-10-16 — End: 2019-10-23

## 2019-10-16 NOTE — ED Triage Notes (Signed)
Patient reports right flank pain, worsening over the last 2 days. History of kidney stones on right and thinks this is related to that.

## 2019-10-16 NOTE — ED Provider Notes (Signed)
[facility] Emergency Department Provider Note  ____________________________________________   First MD Initiated Contact with Patient 10/16/19 1202     (approximate)  I have reviewed the triage vital signs and the nursing notes.   HISTORY  Chief Complaint Flank Pain    HPI Jack Fields is a 29 y.o. male here with right-sided abdominal and flank pain.  The patient states that his symptoms started approximately a week ago.  He states that he woke up from sleep and felt an aching, throbbing, right flank and back pain.  The pain has been fairly constant since onset.  He states he has a history of kidney stones and had similar symptoms in the past, but those symptoms are now migrated more towards the middle.  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  The pain is worse with movement and palpation.  He has not taken anything for it.  No specific alleviating factors.  Denies any overt dysuria, though he has noticed some darkened urine recently.  No fevers or chills.  No nausea or vomiting.  No weight loss or night sweats.  No other complaints.        History reviewed. No pertinent past medical history.  There are no problems to display for this patient.   History reviewed. No pertinent surgical history.  Prior to Admission medications   Medication Sig Start Date End Date Taking? Authorizing Provider  HYDROcodone-acetaminophen (NORCO/VICODIN) 5-325 MG tablet Take 1 tablet by mouth every 6 (six) hours as needed for moderate pain. 09/24/19   Tommi Rumps, PA-C  naproxen (NAPROSYN) 500 MG tablet Take 1 tablet (500 mg total) by mouth 2 (two) times daily with a meal for 7 days. 10/16/19 10/23/19  Shaune Pollack, MD  famotidine (PEPCID) 20 MG tablet Take 1 tablet (20 mg total) by mouth 2 (two) times daily. 04/02/18 09/24/19  Emily Filbert, MD    Allergies Tramadol  No family history on file.  Social History Social  History   Tobacco Use  . Smoking status: Current Every Day Smoker  . Smokeless tobacco: Never Used  Substance Use Topics  . Alcohol use: No  . Drug use: No    Review of Systems  Review of Systems  Constitutional: Negative for chills and fever.  HENT: Negative for sore throat.   Respiratory: Negative for shortness of breath.   Cardiovascular: Negative for chest pain.  Gastrointestinal: Negative for abdominal pain.  Genitourinary: Positive for flank pain.  Musculoskeletal: Positive for arthralgias and back pain. Negative for neck pain.  Skin: Negative for rash and wound.  Allergic/Immunologic: Negative for immunocompromised state.  Neurological: Negative for weakness and numbness.  Hematological: Does not bruise/bleed easily.  All other systems reviewed and are negative.    ____________________________________________  PHYSICAL EXAM:      VITAL SIGNS: ED Triage Vitals [10/16/19 1128]  Enc Vitals Group     BP (!) 133/103     Pulse Rate 73     Resp 18     Temp 99 F (37.2 C)     Temp Source Oral     SpO2 99 %     Weight 185 lb 3 oz (84 kg)     Height 5\' 9"  (1.753 m)     Head Circumference      Peak Flow      Pain Score 8     Pain Loc      Pain Edu?  Excl. in [place]?      Physical Exam Vitals and nursing note reviewed.  Constitutional:      General: He is not in acute distress.    Appearance: He is well-developed.  HENT:     Head: Normocephalic and atraumatic.  Eyes:     Conjunctiva/sclera: Conjunctivae normal.  Cardiovascular:     Rate and Rhythm: Normal rate and regular rhythm.     Heart sounds: Normal heart sounds. No murmur heard.  No friction rub.  Pulmonary:     Effort: Pulmonary effort is normal. No respiratory distress.     Breath sounds: Normal breath sounds. No wheezing or rales.  Abdominal:     General: Abdomen is flat. There is no distension.     Palpations: Abdomen is soft.     Tenderness: There is no abdominal tenderness.    Musculoskeletal:     Cervical back: Neck supple.     Comments: Moderate paraspinal tenderness in the right upper lumbar spine radiating towards the right flank.  No bruising deformity.  No specific midline tenderness.  No skin rashes or lesions.  Skin:    General: Skin is warm.     Capillary Refill: Capillary refill takes less than 2 seconds.  Neurological:     Mental Status: He is alert and oriented to person, place, and time.     Motor: No abnormal muscle tone.       ____________________________________________   LABS (all labs ordered are listed, but only abnormal results are displayed)  Labs Reviewed  URINALYSIS, COMPLETE (UACMP) WITH MICROSCOPIC - Abnormal; Notable for the following components:      Result Value   Color, Urine YELLOW (*)    APPearance CLEAR (*)    Hgb urine dipstick LARGE (*)    Leukocytes,Ua SMALL (*)    Bacteria, UA RARE (*)    All other components within normal limits  CBC  BASIC METABOLIC PANEL    ____________________________________________  EKG:  ________________________________________  RADIOLOGY All imaging, including plain films, CT scans, and ultrasounds, independently reviewed by me, and interpretations confirmed via formal radiology reads.  ED MD interpretation:   CT stone: No acute abnormality, nonspecific enlarged lymph nodes  Official radiology report(s): CT Renal Stone Study  Result Date: 10/16/2019 CLINICAL DATA:  Hematuria, right flank pain EXAM: CT ABDOMEN AND PELVIS WITHOUT CONTRAST TECHNIQUE: Multidetector CT imaging of the abdomen and pelvis was performed following the standard protocol without IV contrast. COMPARISON:  None. FINDINGS: Lower chest: No acute abnormality. Hepatobiliary: No solid liver abnormality is seen. No gallstones, gallbladder wall thickening, or biliary dilatation. Pancreas: Unremarkable. No pancreatic ductal dilatation or surrounding inflammatory changes. Spleen: Normal in size without significant  abnormality. Adrenals/Urinary Tract: Adrenal glands are unremarkable. Kidneys are normal, without renal calculi, solid lesion, or hydronephrosis. Bladder is unremarkable. Stomach/Bowel: Stomach is within normal limits. Appendix appears normal. No evidence of bowel wall thickening, distention, or inflammatory changes. Vascular/Lymphatic: No significant vascular findings are present. Numerous prominent retroperitoneal and iliac lymph nodes. Reproductive: No mass or other significant abnormality. Other: No abdominal wall hernia or abnormality. No abdominopelvic ascites. Musculoskeletal: No acute or significant osseous findings. IMPRESSION: 1. No urinary tract calculi or hydronephrosis are appreciated on this examination. 2. There are numerous prominent, although nonspecific retroperitoneal and iliac lymph nodes, unchanged compared to prior examination and of uncertain significance. Electronically Signed   By: Eddie Candle M.D.   On: 10/16/2019 13:19    ____________________________________________  PROCEDURES   Procedure(s) performed (including Critical Care):  Procedures  ____________________________________________  INITIAL IMPRESSION / MDM / ASSESSMENT AND PLAN / ED COURSE  As part of my medical decision making, I reviewed the following data within the electronic MEDICAL RECORD NUMBER Nursing notes reviewed and incorporated, Old chart reviewed, Notes from prior ED visits, and [place] Controlled Substance Database       *Aristeo Hankerson was evaluated in Emergency Department on 10/16/2019 for the symptoms described in the history of present illness. He was evaluated in the context of the global COVID-19 pandemic, which necessitated consideration that the patient might be at risk for infection with the SARS-CoV-2 virus that causes COVID-19. Institutional protocols and algorithms that pertain to the evaluation of patients at risk for COVID-19 are in a state of rapid change based on information released by  regulatory bodies including the CDC and federal and state organizations. These policies and algorithms were followed during the patient's care in the ED.  Some ED evaluations and interventions may be delayed as a result of limited staffing during the pandemic.*     Medical Decision Making: 29 year old male here with right flank pain.  The pain is somewhat reproducible on exam.  Suspect possible musculoskeletal pain versus recently passed kidney stone.  Some mild hematuria consistent with this.  CT scan obtained and shows no evidence of active, acute abnormality.  No evidence of obstructing stone.  Appendix appears normal.  He does have some nonspecific slightly enlarged diffuse lymph nodes.  White blood cell count is normal with no evidence to suggest leukemia or lymphoma.  This has been previously noted.  Unclear etiology, will advise him to follow-up with the PCP for further testing and monitoring for this.  Otherwise, will treat for possible musculoskeletal pain with NSAIDs.  Regarding his enlarged lymph nodes, as mentioned he has no evidence of lymphadenopathy on exam or evidence to suggest underlying malignancy.  He denies history of IV drug use, and has no clinical fever, chills, leukocytosis, or evidence to suggest epidural abscess or other infectious spinal etiology.  Will be monitored as an outpatient.  Encouraged PCP follow-up.  ____________________________________________  FINAL CLINICAL IMPRESSION(S) / ED DIAGNOSES  Final diagnoses:  Acute right flank pain  Enlarged lymph node     MEDICATIONS GIVEN DURING THIS VISIT:  Medications  ketorolac (TORADOL) injection 60 mg (60 mg Intramuscular Refused 10/16/19 1254)     ED Discharge Orders         Ordered    naproxen (NAPROSYN) 500 MG tablet  2 times daily with meals     Discontinue     10/16/19 1353           Note:  This document was prepared using Dragon voice recognition software and may include unintentional dictation  errors.   Shaune Pollack, MD 10/16/19 1353

## 2019-10-16 NOTE — Discharge Instructions (Signed)
As we discussed, I suspect your pain is due to musculoskeletal pain.  I prescribed something for this.  That being said, it could also be due to a kidney stone and I encourage you to drink plenty of fluids and try to stay as hydrated as you can.  Your CT scan did show slightly enlarged lymph nodes in your abdomen.  This is likely chronic, but you should follow-up with your primary doctor in the next several weeks to repeat an exam and lab work.  This could be from infection, inflammation, and less likely, though also a consideration, malignancy.

## 2019-10-16 NOTE — ED Triage Notes (Signed)
Formatting of this note might be different from the original.  Patient reports right flank pain, worsening over the last 2 days. History of kidney stones on right and thinks this is related to that.   Electronically signed by Heywood Bene, RN at 10/16/2019 11:27 AM EDT

## 2019-10-16 NOTE — ED Provider Notes (Signed)
Formatting of this note is different from the original.  Images from the original note were not included.      [facility]  Emergency Department Provider Note    ____________________________________________     First MD Initiated Contact with Patient 10/16/19 1202      (approximate)    I have reviewed the triage vital signs and the nursing notes.    HISTORY    Chief Complaint  Flank Pain    HPI  Terry Singleton is a 29 y.o. male here with right-sided abdominal and flank pain.  The patient states that his symptoms started approximately a week ago.  He states that he woke up from sleep and felt an aching, throbbing, right flank and back pain.  The pain has been fairly constant since onset.  He states he has a history of kidney stones and had similar symptoms in the past, but those symptoms are now migrated more towards the middle.  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  The pain is worse with movement and palpation.  He has not taken anything for it.  No specific alleviating factors.  Denies any overt dysuria, though he has noticed some darkened urine recently.  No fevers or chills.  No nausea or vomiting.  No weight loss or night sweats.  No other complaints.       History reviewed. No pertinent past medical history.    There are no problems to display for this patient.    History reviewed. No pertinent surgical history.    Prior to Admission medications    Medication Sig Start Date End Date Taking? Authorizing Provider   HYDROcodone-acetaminophen (NORCO/VICODIN) 5-325 MG tablet Take 1 tablet by mouth every 6 (six) hours as needed for moderate pain. 09/24/19   Tommi Rumps, PA-C   naproxen (NAPROSYN) 500 MG tablet Take 1 tablet (500 mg total) by mouth 2 (two) times daily with a meal for 7 days. 10/16/19 10/23/19  Shaune Pollack, MD   famotidine (PEPCID) 20 MG tablet Take 1 tablet (20 mg total) by mouth 2 (two) times daily. 04/02/18 09/24/19  Emily Filbert, MD     Allergies  Tramadol    No family history on file.    Social History  Social History     Tobacco Use   ? Smoking status: Current Every Day Smoker   ? Smokeless tobacco: Never Used   Substance Use Topics   ? Alcohol use: No   ? Drug use: No     Review of Systems   Review of Systems   Constitutional: Negative for chills and fever.   HENT: Negative for sore throat.    Respiratory: Negative for shortness of breath.    Cardiovascular: Negative for chest pain.   Gastrointestinal: Negative for abdominal pain.   Genitourinary: Positive for flank pain.   Musculoskeletal: Positive for arthralgias and back pain. Negative for neck pain.   Skin: Negative for rash and wound.   Allergic/Immunologic: Negative for immunocompromised state.   Neurological: Negative for weakness and numbness.   Hematological: Does not bruise/bleed easily.   All other systems reviewed and are negative.      ____________________________________________    PHYSICAL EXAM:        VITAL SIGNS:  ED Triage Vitals [10/16/19 1128]   Enc Vitals Group      BP (!) 133/103      Pulse Rate 73  Resp 18      Temp 99 F (37.2 C)      Temp Source Oral      SpO2 99 %      Weight 185 lb 3 oz (84 kg)      Height 5\' 9"  (1.753 m)      Head Circumference       Peak Flow       Pain Score 8      Pain Loc       Pain Edu?       Excl. in [place]?      Physical Exam  Vitals and nursing note reviewed.   Constitutional:       General: He is not in acute distress.     Appearance: He is well-developed.   HENT:      Head: Normocephalic and atraumatic.   Eyes:      Conjunctiva/sclera: Conjunctivae normal.   Cardiovascular:      Rate and Rhythm: Normal rate and regular rhythm.      Heart sounds: Normal heart sounds. No murmur heard.   No friction rub.   Pulmonary:      Effort: Pulmonary effort is normal. No respiratory distress.      Breath sounds: Normal breath sounds. No wheezing or rales.   Abdominal:      General: Abdomen is flat. There is no distension.       Palpations: Abdomen is soft.      Tenderness: There is no abdominal tenderness.   Musculoskeletal:      Cervical back: Neck supple.      Comments: Moderate paraspinal tenderness in the right upper lumbar spine radiating towards the right flank.  No bruising deformity.  No specific midline tenderness.  No skin rashes or lesions.   Skin:     General: Skin is warm.      Capillary Refill: Capillary refill takes less than 2 seconds.   Neurological:      Mental Status: He is alert and oriented to person, place, and time.      Motor: No abnormal muscle tone.         ____________________________________________    LABS  (all labs ordered are listed, but only abnormal results are displayed)    Labs Reviewed   URINALYSIS, COMPLETE (UACMP) WITH MICROSCOPIC - Abnormal; Notable for the following components:       Result Value    Color, Urine YELLOW (*)     APPearance CLEAR (*)     Hgb urine dipstick LARGE (*)     Leukocytes,Ua SMALL (*)     Bacteria, UA RARE (*)     All other components within normal limits   CBC   BASIC METABOLIC PANEL     ____________________________________________    EKG:   ________________________________________    RADIOLOGY  All imaging, including plain films, CT scans, and ultrasounds, independently reviewed by me, and interpretations confirmed via formal radiology reads.    ED MD interpretation:    CT stone: No acute abnormality, nonspecific enlarged lymph nodes    Official radiology report(s):  CT Renal Stone Study    Result Date: 10/16/2019  CLINICAL DATA:  Hematuria, right flank pain EXAM: CT ABDOMEN AND PELVIS WITHOUT CONTRAST TECHNIQUE: Multidetector CT imaging of the abdomen and pelvis was performed following the standard protocol without IV contrast. COMPARISON:  None. FINDINGS: Lower chest: No acute abnormality. Hepatobiliary: No solid liver abnormality is seen. No gallstones, gallbladder wall thickening, or biliary dilatation. Pancreas: Unremarkable.  No pancreatic ductal dilatation or  surrounding inflammatory changes. Spleen: Normal in size without significant abnormality. Adrenals/Urinary Tract: Adrenal glands are unremarkable. Kidneys are normal, without renal calculi, solid lesion, or hydronephrosis. Bladder is unremarkable. Stomach/Bowel: Stomach is within normal limits. Appendix appears normal. No evidence of bowel wall thickening, distention, or inflammatory changes. Vascular/Lymphatic: No significant vascular findings are present. Numerous prominent retroperitoneal and iliac lymph nodes. Reproductive: No mass or other significant abnormality. Other: No abdominal wall hernia or abnormality. No abdominopelvic ascites. Musculoskeletal: No acute or significant osseous findings. IMPRESSION: 1. No urinary tract calculi or hydronephrosis are appreciated on this examination. 2. There are numerous prominent, although nonspecific retroperitoneal and iliac lymph nodes, unchanged compared to prior examination and of uncertain significance. Electronically Signed   By: Lauralyn Primes M.D.   On: 10/16/2019 13:19     ____________________________________________    PROCEDURES    Procedure(s) performed (including Critical Care):    Procedures    ____________________________________________    INITIAL IMPRESSION / MDM / ASSESSMENT AND PLAN / ED COURSE    As part of my medical decision making, I reviewed the following data within the electronic MEDICAL RECORD NUMBER Nursing notes reviewed and incorporated, Old chart reviewed, Notes from prior ED visits, and [place] Controlled Substance Database        *Terry Singleton was evaluated in Emergency Department on 10/16/2019 for the symptoms described in the history of present illness. He was evaluated in the context of the global COVID-19 pandemic, which necessitated consideration that the patient might be at risk for infection with the SARS-CoV-2 virus that causes COVID-19. Institutional protocols and algorithms that pertain to the evaluation of patients at risk for  COVID-19 are in a state of rapid change based on information released by regulatory bodies including the CDC and federal and state organizations. These policies and algorithms were followed during the patient's care in the ED.  Some ED evaluations and interventions may be delayed as a result of limited staffing during the pandemic.*        Medical Decision Making: 29 year old male here with right flank pain.  The pain is somewhat reproducible on exam.  Suspect possible musculoskeletal pain versus recently passed kidney stone.  Some mild hematuria consistent with this.  CT scan obtained and shows no evidence of active, acute abnormality.  No evidence of obstructing stone.  Appendix appears normal.  He does have some nonspecific slightly enlarged diffuse lymph nodes.  White blood cell count is normal with no evidence to suggest leukemia or lymphoma.  This has been previously noted.  Unclear etiology, will advise him to follow-up with the PCP for further testing and monitoring for this.  Otherwise, will treat for possible musculoskeletal pain with NSAIDs.  Regarding his enlarged lymph nodes, as mentioned he has no evidence of lymphadenopathy on exam or evidence to suggest underlying malignancy.  He denies history of IV drug use, and has no clinical fever, chills, leukocytosis, or evidence to suggest epidural abscess or other infectious spinal etiology.  Will be monitored as an outpatient.  Encouraged PCP follow-up.    ____________________________________________    FINAL CLINICAL IMPRESSION(S) / ED DIAGNOSES    Final diagnoses:   Acute right flank pain   Enlarged lymph node     MEDICATIONS GIVEN DURING THIS VISIT:    Medications   ketorolac (TORADOL) injection 60 mg (60 mg Intramuscular Refused 10/16/19 1254)     ED Discharge Orders          Ordered  naproxen (NAPROSYN) 500 MG tablet  2 times daily with meals     Discontinue      10/16/19 1353           Note:  This document was prepared using Dragon voice recognition  software and may include unintentional dictation errors.    Duffy Bruce, MD  10/16/19 1353    Electronically signed by Duffy Bruce, MD at 10/16/2019  1:53 PM EDT

## 2021-01-06 NOTE — ED Provider Notes (Signed)
Associated Order(s): Lac Repair; Lac Repair  Formatting of this note is different from the original.  Images from the original note were not included.    [facility]    ED Provider Note    Rebeca AllegraChristopher Sherburne 30 y.o. male DOB: 1990/11/12 MRN: [id number]  History     Chief Complaint   Patient presents with   ? Arm Laceration     Lac to L elbow about 1 hour ago when he was working on a garage door.  This is a Work Related Injury.  Sts something about a spring and metal cutting his L elbow.  Unknown last tetanus.  VSS.  Bleeding is controlled with a mild pressure dressing.      30 year old male patient presents emerged from complaint of laceration to his left elbow.  Injury occurred about an hour ago.  Patient states he was working on a garage door when the spring broke and hit him in the left elbow.  Patient planes of laceration to the same.  Last tetanus is unknown.  Patient denies any other injury.  Denies range of motion deficits.    History reviewed. No pertinent past medical history.    History reviewed. No pertinent surgical history.    Social History     Substance and Sexual Activity   Alcohol Use Not Currently     Social History     Tobacco Use   Smoking Status Current Every Day Smoker   ? Types: Cigars   Smokeless Tobacco Never Used   Tobacco Comment    Smoke "Black and milds"     E-Cigarettes   ? Vaping Use     ? Start Date     ? Cartridges/Day     ? Quit Date       Social History     Substance and Sexual Activity   Drug Use Not on file     Tetanus up to date?: Unknown  Immunizations Up to Date?: Unknown    Allergies   Allergen Reactions   ? Other Shortness Of Breath     Pt thinks he is allergic to tramadol but is not sure.       Home Medications    No medications on file     Review of Systems     Review of Systems   Constitutional: Negative for chills, fatigue and fever.   HENT: Negative for dental problem, ear pain and sore throat.    Eyes: Negative for pain and redness.    Respiratory: Negative for cough and shortness of breath.    Cardiovascular: Negative for chest pain.   Gastrointestinal: Negative for abdominal pain, nausea and vomiting.   Genitourinary: Negative for dysuria and hematuria.   Musculoskeletal: Negative for back pain and neck pain.   Skin: Positive for wound. Negative for color change and pallor.        Patient complains of laceration to left elbow   Neurological: Negative for dizziness, syncope, numbness and headaches.     Physical Exam     ED Triage Vitals [01/06/21 1250]   BP 125/77   Heart Rate 83   Resp 17   SpO2 100 %   Temp 98.1 F (36.7 C)     Physical Exam   Nursing note and vitals reviewed.  Constitutional: Vitals signs normalHe appears well-developed and well-nourished.   HENT:   Head: Normocephalic and atraumatic.   Eyes: EOM are intact. Lids are normal. Pupils are equal, round, and reactive  to light.   Neck: Trachea normal, normal range of motion and full passive range of motion without pain.   Cardiovascular: Normal rate and regular rhythm.   Pulmonary/Chest: Respiratory effort normal and breath sounds normal.   Abdominal: Soft. Bowel sounds are normal.   Musculoskeletal: Normal range of motion.      Left elbow: Laceration present.      Cervical back: Full passive range of motion without pain and normal range of motion.        Arms:       Comments:   Patient is full range of motion of upper extremities with no deficits.  Patient is neurovascularly intact in the bilateral upper and lower extremities.     Neurological: He is alert and oriented to person, place, and time.   Skin: Skin is warm. Skin is intact. Skin is dry.   Psychiatric: He has a normal mood and affect. His behavior is normal.     ED Course     Lab results:  No data to display    Imaging:    XR ELBOW MIN 3 VIEWS LEFT    Narrative:     INDICATION:  Injury.      COMPARISON:  none    TECHNIQUE: 4 views     FINDINGS: There is no fracture, dislocation, or significant degenerative change  identified. The soft tissues are unremarkable.     Impression:     IMPRESSION: Normal study.     Electronically Signed by: Hillary Bow on 01/06/2021 1:58 PM     ECG:  ECG Results    None                          Medications administered during ED encounter:    Medications   Tdap (BOOSTRIX) injection 0.5 mL (has no administration in time range)   lidocaine 1% (PF) 10 mg/mL injection 10 mL (10 mLs Other Given 01/06/21 1321)     Pre-Sedation  Lac Repair    Date/Time: 01/06/2021 2:55 PM  Performed by: Loni Muse, PA-C  Authorized by: Loni Muse, PA-C     Consent:     Consent obtained:  Verbal    Consent given by:  Patient    Risks discussed:  Infection, pain, need for additional repair and nerve damage  Universal protocol:     Procedure explained and questions answered to patient or proxy's satisfaction: YES      Relevant documents present and verified: YES      Test results available and properly labeled: YES      Imaging studies available: YES      Site/side marked: YES      Immediately prior to procedure, a time out was called: YES      Patient identity confirmed:  Verbally with patient  Anesthesia (see MAR for exact dosages):     Anesthesia method:  Local infiltration    Local anesthetic:  Lidocaine 1% w/o epi  Laceration details:     Location:  Shoulder/arm    Shoulder/arm location:  L elbow    Length (cm):  3    Depth (mm):  3  Repair type:     Repair type:  Intermediate  Pre-procedure details:     Preparation:  Patient was prepped and draped in usual sterile fashion  Exploration:     Hemostasis achieved with:  Direct pressure    Wound exploration: wound explored through full range of motion and  entire depth of wound probed and visualized      Wound extent: no nerve damage, no tendon damage, no underlying fracture and no vascular damage      Wound extent comment:  L-shaped laceration    Contaminated: no    Treatment:     Area cleansed with:  Betadine    Amount of cleaning:  Standard    Irrigation solution:   Sterile saline    Irrigation volume:     Irrigation method:  Syringe    Visualized foreign bodies/material removed: no    Skin repair:     Repair method:  Sutures and staples    Suture size:  4-0    Suture material:  Nylon    Suture technique:  Simple interrupted    Number of sutures:  3    Number of staples:  2  Approximation:     Approximation:  Close    Vermilion border: well-aligned    Post-procedure details:   Post-procedure details:   Was the procedure successful? Yes.  Post procedure diagnosis: Left elbow laceration #2.  Estimated Blood loss: None  Specimen Collected? No.    Patient tolerance of procedure:  Tolerated well, no immediate complications       Dressing:  Antibiotic ointment and non-adherent dressing    Patient tolerance of procedure:  Tolerated well, no immediate complications  Lac Repair    Date/Time: 01/06/2021 2:55 PM  Performed by: Loni Muse, PA-C  Authorized by: Loni Muse, PA-C     Consent:     Consent obtained:  Verbal    Consent given by:  Patient    Risks discussed:  Infection, pain, need for additional repair and nerve damage  Universal protocol:     Procedure explained and questions answered to patient or proxy's satisfaction: YES      Relevant documents present and verified: YES      Test results available and properly labeled: YES      Imaging studies available: YES      Site/side marked: YES      Immediately prior to procedure, a time out was called: YES      Patient identity confirmed:  Verbally with patient  Anesthesia (see MAR for exact dosages):     Anesthesia method:  Local infiltration    Local anesthetic:  Lidocaine 1% w/o epi  Laceration details:     Location:  Shoulder/arm    Shoulder/arm location:  L elbow    Length (cm):  2    Depth (mm):  2  Repair type:     Repair type:  Intermediate  Pre-procedure details:     Preparation:  Patient was prepped and draped in usual sterile fashion  Exploration:     Hemostasis achieved with:  Direct pressure    Wound exploration:  wound explored through full range of motion and entire depth of wound probed and visualized      Wound extent: no nerve damage, no tendon damage, no underlying fracture and no vascular damage      Contaminated: no    Treatment:     Area cleansed with:  Betadine    Amount of cleaning:  Standard    Irrigation solution:  Sterile saline    Irrigation volume:     Irrigation method:  Syringe    Visualized foreign bodies/material removed: no    Skin repair:     Repair method:  Sutures    Suture size:  4-0  Suture material:  Nylon    Suture technique:  Simple interrupted    Number of sutures:  3  Approximation:     Approximation:  Close    Vermilion border: well-aligned    Post-procedure details:   Post-procedure details:   Was the procedure successful? Yes.  Post procedure diagnosis: Left elbow laceration #1.  Estimated Blood loss: None  Specimen Collected? No.    Patient tolerance of procedure:  Tolerated well, no immediate complications       Dressing:  Antibiotic ointment and non-adherent dressing    Patient tolerance of procedure:  Tolerated well, no immediate complications      MDM  Number of Diagnoses or Management Options  Elbow laceration, left, initial encounter  Diagnosis management comments:     Based on HPI and physical exam, the following diagnostics were ordered:      Orders Placed This Encounter      Laceration repair      Laceration repair      Xr Elbow Min 3 Views Left      Suture tray set up at bedside    Differential diagnosis:   Open fracture, fracture, joint disruption, laceration.    All radiology studies were reviewed.  Please see results above in the note.    My exam, findings, treatment plan has been discussed with the patient / family.  Patient / family understand and agree with the findings, diagnosis, and treatment plan.  Will discharge at this time.    Please see the  'After Visit Summary' discharge instructions for more in-depth and detailed discharge plan.      Amount and/or  Complexity of Data Reviewed  Tests in the radiology section of CPT: reviewed and ordered    Risk of Complications, Morbidity, and/or Mortality  Presenting problems: low  Diagnostic procedures: low  Management options: low    Patient Progress  Patient progress: stable    MDM  Reviewed: nursing note and vitals  Interpretation: x-ray    Provider Communication    New Prescriptions    CEPHALEXIN (KEFLEX) 500 MG CAPSULE    Take one capsule (500 mg dose) by mouth 4 (four) times daily for 7 days.       Quantity: 28 capsule    Refills: 0    TRAMADOL (ULTRAM) 50 MG TABLET    Take one half tablet to one tablet (25-50 mg dose) by mouth every 6 (six) hours as needed for Pain for up to 3 days.       Quantity: 12 tablet    Refills: 0     Modified Medications    No medications on file     Discontinued Medications    No medications on file     Clinical Impression     Final diagnoses:   Elbow laceration, left, initial encounter     ED Disposition     ED Disposition   Discharge    Condition   Stable    Comment   --                  Follow-up Information     [facility] In 2 days.    Comments: As needed  Contact information:  [address] [place] [address]  [phone]                This care is provided during an unprecedented national emergency due to the Novel Coronavirus (COVID-19). COVID-19  infections and transmission risks place heavy strains on healthcare resources. As this pandemic evolves, the Hospital and providers strive to respond fluidly, to remain operational, and to provide care relative to available resources and information. Outcomes are unpredictable and treatments are without well-defined guidelines. Further, the impact of COVID-19 on all aspects of emergency care, including the impact to patients seeking care for reasons other than COVID-19, is unavoidable during this national emergency.    Electronically signed by:    Loni Muse,  PA-C  01/06/21 1457    Electronically signed by Yehuda Budd, DO at 01/08/2021  7:22 PM EDT    Associated attestation - Yehuda Budd, DO - 01/08/2021  7:22 PM EDT  Formatting of this note might be different from the original.  I did not perform a face to face evaluation of this patient.  However, I was readily available for any questions and hands-on evaluation if requested.

## 2021-01-06 NOTE — ED Notes (Signed)
Formatting of this note might be different from the original.  Suture kit & lidocaine at pt bedside. Provider aware.  Electronically signed by Richarda Overlie, RN at 01/06/2021  2:27 PM EDT

## 2021-04-26 ENCOUNTER — Emergency Department: Payer: Self-pay

## 2021-04-26 ENCOUNTER — Other Ambulatory Visit: Payer: Self-pay

## 2021-04-26 ENCOUNTER — Encounter: Payer: Self-pay | Admitting: Emergency Medicine

## 2021-04-26 ENCOUNTER — Emergency Department
Admission: EM | Admit: 2021-04-26 | Discharge: 2021-04-26 | Disposition: A | Discharge status: Home / Self Care | Payer: Self-pay | Attending: Emergency Medicine | Admitting: Emergency Medicine

## 2021-04-26 DIAGNOSIS — G5601 Carpal tunnel syndrome, right upper limb: Secondary | ICD-10-CM | POA: Insufficient documentation

## 2021-04-26 DIAGNOSIS — R109 Unspecified abdominal pain: Secondary | ICD-10-CM

## 2021-04-26 DIAGNOSIS — N12 Tubulo-interstitial nephritis, not specified as acute or chronic: Secondary | ICD-10-CM | POA: Insufficient documentation

## 2021-04-26 DIAGNOSIS — F17200 Nicotine dependence, unspecified, uncomplicated: Secondary | ICD-10-CM | POA: Insufficient documentation

## 2021-04-26 LAB — URINALYSIS, ROUTINE W REFLEX MICROSCOPIC
Bilirubin Urine: NEGATIVE
Glucose, UA: NEGATIVE mg/dL
Ketones, ur: 5 mg/dL — AB
Nitrite: NEGATIVE
Protein, ur: NEGATIVE mg/dL
Specific Gravity, Urine: 1.026 (ref 1.005–1.030)
pH: 6 (ref 5.0–8.0)

## 2021-04-26 LAB — CBC
HCT: 44.8 % (ref 39.0–52.0)
Hemoglobin: 14.9 g/dL (ref 13.0–17.0)
MCH: 30.1 pg (ref 26.0–34.0)
MCHC: 33.3 g/dL (ref 30.0–36.0)
MCV: 90.5 fL (ref 80.0–100.0)
Platelets: 273 10*3/uL (ref 150–400)
RBC: 4.95 MIL/uL (ref 4.22–5.81)
RDW: 13.2 % (ref 11.5–15.5)
WBC: 8.7 10*3/uL (ref 4.0–10.5)
nRBC: 0 % (ref 0.0–0.2)

## 2021-04-26 LAB — BASIC METABOLIC PANEL
Anion gap: 4 — ABNORMAL LOW (ref 5–15)
BUN: 16 mg/dL (ref 6–20)
CO2: 31 mmol/L (ref 22–32)
Calcium: 9.2 mg/dL (ref 8.9–10.3)
Chloride: 100 mmol/L (ref 98–111)
Creatinine, Ser: 0.87 mg/dL (ref 0.61–1.24)
GFR, Estimated: >60 mL/min (ref >60)
Glucose, Bld: 115 mg/dL — ABNORMAL HIGH (ref 70–99)
Potassium: 4 mmol/L (ref 3.5–5.1)
Sodium: 135 mmol/L (ref 135–145)

## 2021-04-26 MED ORDER — KETOROLAC TROMETHAMINE 30 MG/ML IJ SOLN
30.0000 mg | Freq: Once | INTRAMUSCULAR | Status: AC
  Administered 2021-04-26: 16:00:00 30 mg via INTRAMUSCULAR
  Filled 2021-04-26: qty 1

## 2021-04-26 MED ORDER — CEPHALEXIN 500 MG PO CAPS
500.0000 mg | ORAL_CAPSULE | Freq: Four times a day (QID) | ORAL | 0 refills | Status: AC
Start: 2021-04-26 — End: 2021-05-06

## 2021-04-26 MED ORDER — CEPHALEXIN 500 MG PO CAPS
500.0000 mg | ORAL_CAPSULE | Freq: Once | ORAL | Status: AC
  Administered 2021-04-26: 17:00:00 500 mg via ORAL
  Filled 2021-04-26: qty 1

## 2021-04-26 NOTE — ED Provider Notes (Signed)
[facility] Emergency Department Provider Note   ____________________________________________   Event Date/Time   First MD Initiated Contact with Patient 04/26/21 1555     (approximate)  I have reviewed the triage vital signs and the nursing notes.   HISTORY  Chief Complaint Flank Pain    HPI Jack Fields is a 30 y.o. male with no significant past medical history presents to the ED complaining of flank pain.  Patient reports that over the past 3 to 4 days he has been dealing with intermittent pain in his left flank that is described as sharp and severe.  He states that it is most severe when he wakes up in the morning, does not appear to be exacerbated or alleviated by anything in particular.  It does not radiate from this location.  He describes it as similar to prior kidney stones and he states he has been urinating frequently with small amounts of blood in his urine.  He has not take anything for his symptoms at home.  He additionally complains of a numb and tingling feeling in his right hand when he wakes up in the morning.  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  She denies any falls or recent trauma to the hand, has not noticed any rashes or discoloration.        History reviewed. No pertinent past medical history.  There are no problems to display for this patient.   History reviewed. No pertinent surgical history.  Prior to Admission medications   Medication Sig Start Date End Date Taking? Authorizing Provider  cephALEXin (KEFLEX) 500 MG capsule Take 1 capsule (500 mg total) by mouth 4 (four) times daily for 10 days. 04/26/21 05/06/21 Yes Blake Divine, MD  HYDROcodone-acetaminophen (NORCO/VICODIN) 5-325 MG tablet Take 1 tablet by mouth every 6 (six) hours as needed for moderate pain. 09/24/19   Johnn Hai, PA-C  famotidine (PEPCID) 20 MG tablet Take  1 tablet (20 mg total) by mouth 2 (two) times daily. 04/02/18 09/24/19  Earleen Newport, MD    Allergies Tramadol  No family history on file.  Social History Social History   Tobacco Use   Smoking status: Every Day   Smokeless tobacco: Never  Substance Use Topics   Alcohol use: No   Drug use: No    Review of Systems  Constitutional: No fever/chills Eyes: No visual changes. ENT: No sore throat. Cardiovascular: Denies chest pain. Respiratory: Denies shortness of breath. Gastrointestinal: No abdominal pain.  No nausea, no vomiting.  No diarrhea.  No constipation.  Positive for flank pain. Genitourinary: Negative for dysuria.  Positive for hematuria. Musculoskeletal: Negative for back pain. Skin: Negative for rash. Neurological: Negative for headaches or focal weakness.  Positive for numbness and tingling to right hand.  ____________________________________________   PHYSICAL EXAM:  VITAL SIGNS: ED Triage Vitals  Enc Vitals Group     BP 04/26/21 1451 131/80     Pulse Rate 04/26/21 1451 84     Resp 04/26/21 1451 18     Temp 04/26/21 1451 98.5 F (36.9 C)     Temp Source 04/26/21 1451 Oral     SpO2 04/26/21 1451 95 %     Weight 04/26/21 1452 180 lb (81.6 kg)     Height 04/26/21 1452 5\' 10"  (1.778 m)     Head Circumference --      Peak  Flow --      Pain Score 04/26/21 1451 6     Pain Loc --      Pain Edu? --      Excl. in GC? --     Constitutional: Alert and oriented. Eyes: Conjunctivae are normal. Head: Atraumatic. Nose: No congestion/rhinnorhea. Mouth/Throat: Mucous membranes are moist. Neck: Normal ROM Cardiovascular: Normal rate, regular rhythm. Grossly normal heart sounds.  2+ radial pulses bilaterally, cap refill less than 2 seconds throughout all digits of right hand. Respiratory: Normal respiratory effort.  No retractions. Lungs CTAB. Gastrointestinal: Soft and nontender.  No CVA tenderness noted.  No distention. Genitourinary:  deferred Musculoskeletal: No lower extremity tenderness nor edema.  Range of motion intact to right elbow and wrist without pain. Neurologic:  Normal speech and language. No gross focal neurologic deficits are appreciated. Skin:  Skin is warm, dry and intact. No rash noted. Psychiatric: Mood and affect are normal. Speech and behavior are normal.  ____________________________________________   LABS (all labs ordered are listed, but only abnormal results are displayed)  Labs Reviewed  URINALYSIS, ROUTINE W REFLEX MICROSCOPIC - Abnormal; Notable for the following components:      Result Value   Color, Urine YELLOW (*)    APPearance HAZY (*)    Hgb urine dipstick MODERATE (*)    Ketones, ur 5 (*)    Leukocytes,Ua MODERATE (*)    Bacteria, UA FEW (*)    All other components within normal limits  BASIC METABOLIC PANEL - Abnormal; Notable for the following components:   Glucose, Bld 115 (*)    Anion gap 4 (*)    All other components within normal limits  URINE CULTURE  CBC    PROCEDURES  Procedure(s) performed (including Critical Care):  Procedures   ____________________________________________   INITIAL IMPRESSION / ASSESSMENT AND PLAN / ED COURSE      30 year old male with no significant past medical history presents to the ED with 3 to 4 days of left flank pain along with urinary frequency and hematuria.  UA shows small amount of blood but no findings to suggest infection, we will further assess with CT scan for kidney stone versus other process to explain his symptoms.  Labs are unremarkable and he has no abdominal tenderness on exam.  His intermittent right hand numbness and tingling seems suggestive of carpal tunnel.  He is neurovascular intact to his right hand and there has not been any recent trauma.  We will place patient's right wrist in a brace and he was counseled on limiting use of his right hand for the next couple of days.  CT scan is negative for acute process,  no evidence of kidney stone or other cause of his left flank pain.  Given questionable infection on UA and no other explanation for patient's flank pain, we will send urine for culture and start him on Keflex.  He is appropriate for discharge home, was counseled to follow-up with PCP and to return to the ED for new worsening symptoms.  Patient agrees with plan.      ____________________________________________   FINAL CLINICAL IMPRESSION(S) / ED DIAGNOSES  Final diagnoses:  Left flank pain  Pyelonephritis  Carpal tunnel syndrome of right wrist     ED Discharge Orders          Ordered    cephALEXin (KEFLEX) 500 MG capsule  4 times daily        04/26/21 1656  Note:  This document was prepared using Dragon voice recognition software and may include unintentional dictation errors.    Blake Divine, MD 04/26/21 [phone]

## 2021-04-26 NOTE — ED Triage Notes (Signed)
Pt to ED via POV with c/o left kidney pain that began a few days ago, he reports that today it is better. He is also complaining of right hand and wrist pain, it goes numb in the morning and he has to hold it and not be able to use it for about an hour

## 2021-04-26 NOTE — ED Provider Notes (Signed)
Formatting of this note is different from the original.  Images from the original note were not included.      [facility]  Emergency Department Provider Note    ____________________________________________     Event Date/Time    First MD Initiated Contact with Patient 04/26/21 1555      (approximate)    I have reviewed the triage vital signs and the nursing notes.    HISTORY    Chief Complaint  Flank Pain    HPI  Terry Singleton is a 30 y.o. male with no significant past medical history presents to the ED complaining of flank pain.  Patient reports that over the past 3 to 4 days he has been dealing with intermittent pain in his left flank that is described as sharp and severe.  He states that it is most severe when he wakes up in the morning, does not appear to be exacerbated or alleviated by anything in particular.  It does not radiate from this location.  He describes it as similar to prior kidney stones and he states he has been urinating frequently with small amounts of blood in his urine.  He has not take anything for his symptoms at home.  He additionally complains of a numb and tingling feeling in his right hand when he wakes up in the morning.  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  She denies any falls or recent trauma to the hand, has not noticed any rashes or discoloration.          History reviewed. No pertinent past medical history.    There are no problems to display for this patient.    History reviewed. No pertinent surgical history.    Prior to Admission medications    Medication Sig Start Date End Date Taking? Authorizing Provider   cephALEXin (KEFLEX) 500 MG capsule Take 1 capsule (500 mg total) by mouth 4 (four) times daily for 10 days. 04/26/21 05/06/21 Yes Chesley Noon, MD   HYDROcodone-acetaminophen (NORCO/VICODIN) 5-325 MG tablet Take 1 tablet by mouth every 6 (six) hours as  needed for moderate pain. 09/24/19   Tommi Rumps, PA-C   famotidine (PEPCID) 20 MG tablet Take 1 tablet (20 mg total) by mouth 2 (two) times daily. 04/02/18 09/24/19  Emily Filbert, MD     Allergies  Tramadol    No family history on file.    Social History  Social History     Tobacco Use    Smoking status: Every Day    Smokeless tobacco: Never   Substance Use Topics    Alcohol use: No    Drug use: No     Review of Systems    Constitutional: No fever/chills  Eyes: No visual changes.  ENT: No sore throat.  Cardiovascular: Denies chest pain.  Respiratory: Denies shortness of breath.  Gastrointestinal: No abdominal pain.  No nausea, no vomiting.  No diarrhea.  No constipation.  Positive for flank pain.  Genitourinary: Negative for dysuria.  Positive for hematuria.  Musculoskeletal: Negative for back pain.  Skin: Negative for rash.  Neurological: Negative for headaches or focal weakness.  Positive for numbness and tingling to right hand.    ____________________________________________    PHYSICAL EXAM:    VITAL SIGNS:  ED Triage Vitals   Enc Vitals Group      BP 04/26/21 1451 131/80  Pulse Rate 04/26/21 1451 84      Resp 04/26/21 1451 18      Temp 04/26/21 1451 98.5 F (36.9 C)      Temp Source 04/26/21 1451 Oral      SpO2 04/26/21 1451 95 %      Weight 04/26/21 1452 180 lb (81.6 kg)      Height 04/26/21 1452 5\' 10"  (1.778 m)      Head Circumference --       Peak Flow --       Pain Score 04/26/21 1451 6      Pain Loc --       Pain Edu? --       Excl. in GC? --      Constitutional: Alert and oriented.  Eyes: Conjunctivae are normal.  Head: Atraumatic.  Nose: No congestion/rhinnorhea.  Mouth/Throat: Mucous membranes are moist.  Neck: Normal ROM  Cardiovascular: Normal rate, regular rhythm. Grossly normal heart sounds.  2+ radial pulses bilaterally, cap refill less than 2 seconds throughout all digits of right hand.  Respiratory: Normal respiratory effort.  No retractions. Lungs CTAB.  Gastrointestinal: Soft  and nontender.  No CVA tenderness noted.  No distention.  Genitourinary: deferred  Musculoskeletal: No lower extremity tenderness nor edema.  Range of motion intact to right elbow and wrist without pain.  Neurologic:  Normal speech and language. No gross focal neurologic deficits are appreciated.  Skin:  Skin is warm, dry and intact. No rash noted.  Psychiatric: Mood and affect are normal. Speech and behavior are normal.    ____________________________________________    LABS  (all labs ordered are listed, but only abnormal results are displayed)    Labs Reviewed   URINALYSIS, ROUTINE W REFLEX MICROSCOPIC - Abnormal; Notable for the following components:       Result Value    Color, Urine YELLOW (*)     APPearance HAZY (*)     Hgb urine dipstick MODERATE (*)     Ketones, ur 5 (*)     Leukocytes,Ua MODERATE (*)     Bacteria, UA FEW (*)     All other components within normal limits   BASIC METABOLIC PANEL - Abnormal; Notable for the following components:    Glucose, Bld 115 (*)     Anion gap 4 (*)     All other components within normal limits   URINE CULTURE   CBC     PROCEDURES    Procedure(s) performed (including Critical Care):    Procedures    ____________________________________________    INITIAL IMPRESSION / ASSESSMENT AND PLAN / ED COURSE        30 year old male with no significant past medical history presents to the ED with 3 to 4 days of left flank pain along with urinary frequency and hematuria.  UA shows small amount of blood but no findings to suggest infection, we will further assess with CT scan for kidney stone versus other process to explain his symptoms.  Labs are unremarkable and he has no abdominal tenderness on exam.  His intermittent right hand numbness and tingling seems suggestive of carpal tunnel.  He is neurovascular intact to his right hand and there has not been any recent trauma.  We will place patient's right wrist in a brace and he was counseled on limiting use of his right hand for the  next couple of days.    CT scan is negative for acute process, no evidence of kidney stone or other cause  of his left flank pain.  Given questionable infection on UA and no other explanation for patient's flank pain, we will send urine for culture and start him on Keflex.  He is appropriate for discharge home, was counseled to follow-up with PCP and to return to the ED for new worsening symptoms.  Patient agrees with plan.        ____________________________________________    FINAL CLINICAL IMPRESSION(S) / ED DIAGNOSES    Final diagnoses:   Left flank pain   Pyelonephritis   Carpal tunnel syndrome of right wrist     ED Discharge Orders            Ordered     cephALEXin (KEFLEX) 500 MG capsule  4 times daily         04/26/21 1656               Note:  This document was prepared using Dragon voice recognition software and may include unintentional dictation errors.      Chesley Noon, MD  04/26/21 1658    Electronically signed by Chesley Noon, MD at 04/26/2021  4:58 PM EST

## 2021-04-26 NOTE — ED Triage Notes (Signed)
Formatting of this note might be different from the original.  Pt to ED via POV with c/o left kidney pain that began a few days ago, he reports that today it is better. He is also complaining of right hand and wrist pain, it goes numb in the morning and he has to hold it and not be able to use it for about an hour   Electronically signed by Laren Boom, RN at 04/26/2021  2:51 PM EST

## 2021-04-28 LAB — URINE CULTURE

## 2021-07-17 IMAGING — CT CT RENAL STONE PROTOCOL
3 of 4 series · 9 of 46 positions shown, 16 images · non-contrast
Comparison: None.

CLINICAL DATA: Right flank pain for 3 weeks. Dysuria. Hematuria.

EXAM:
CT ABDOMEN AND PELVIS WITHOUT CONTRAST
TECHNIQUE: Multidetector CT imaging of the abdomen and pelvis was performed
following the standard protocol without IV contrast.

[Series 4: lung bases · axial · 0.68mm/px · z∈[-659,-579]mm · 5 of 24 slices shown, 10 images]
[im 4/24  soft-tissue]
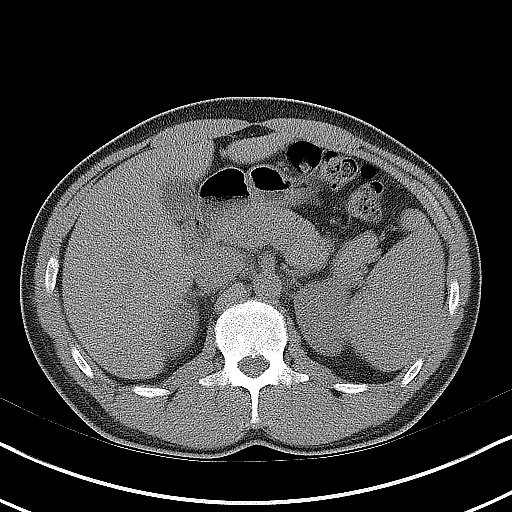
[im 4/24  bone]
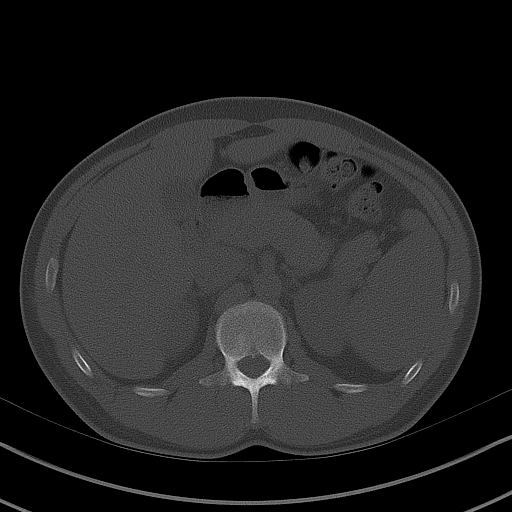
[im 8/24  soft-tissue]
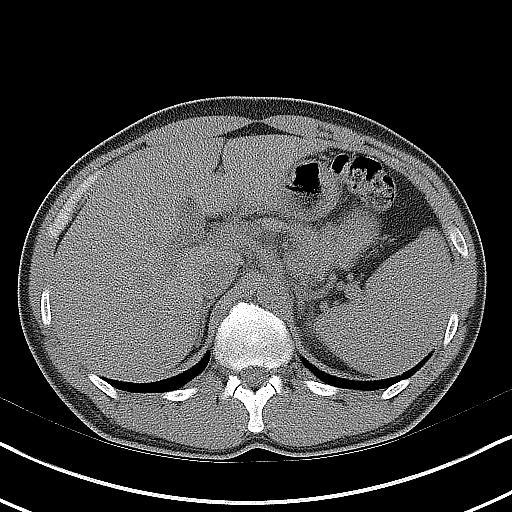
[im 8/24  lung]
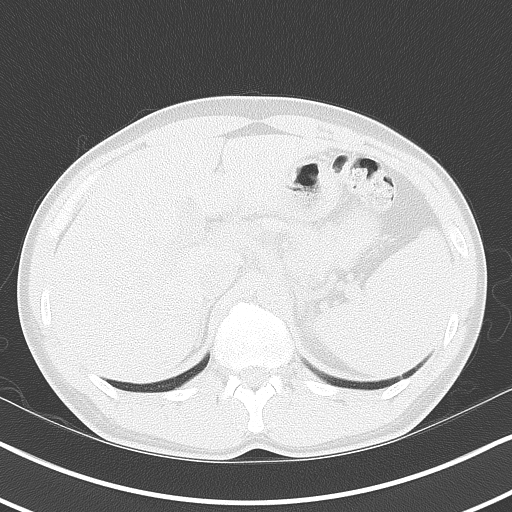
[im 12/24  soft-tissue]
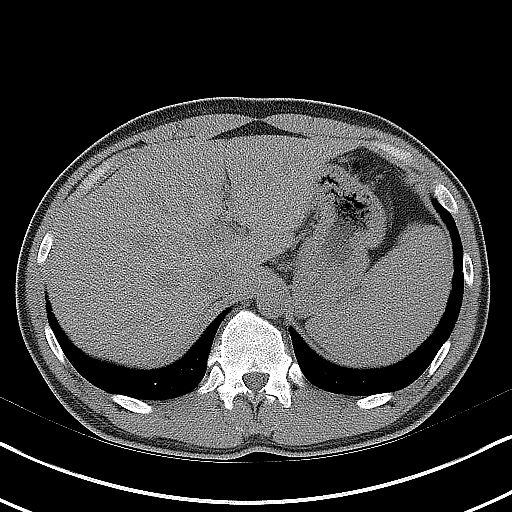
[im 12/24  lung]
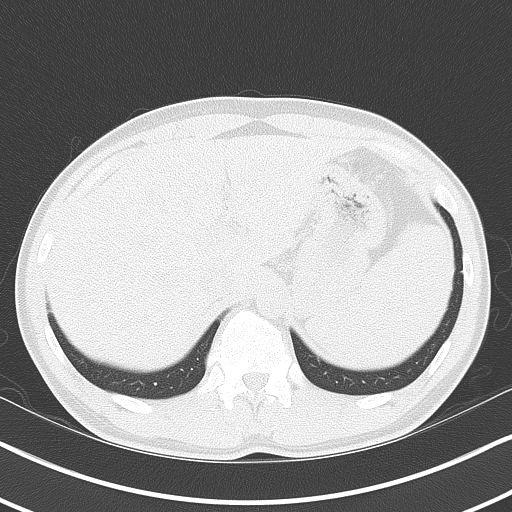
[im 16/24  soft-tissue]
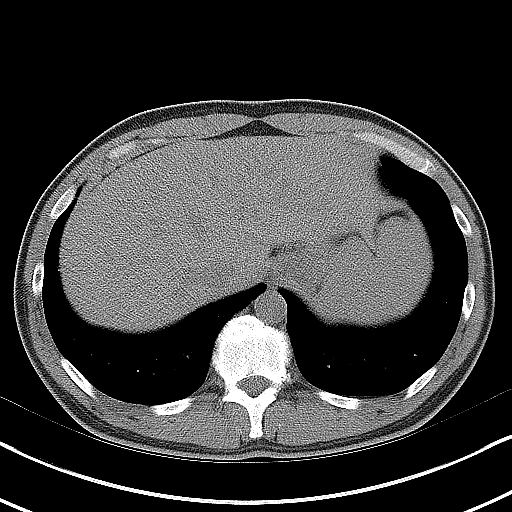
[im 16/24  lung]
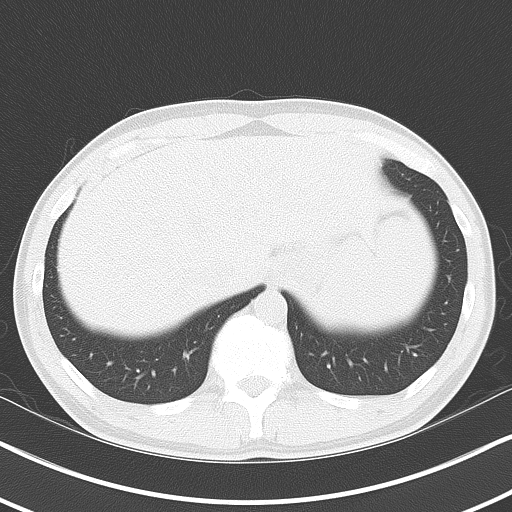
[im 20/24  soft-tissue]
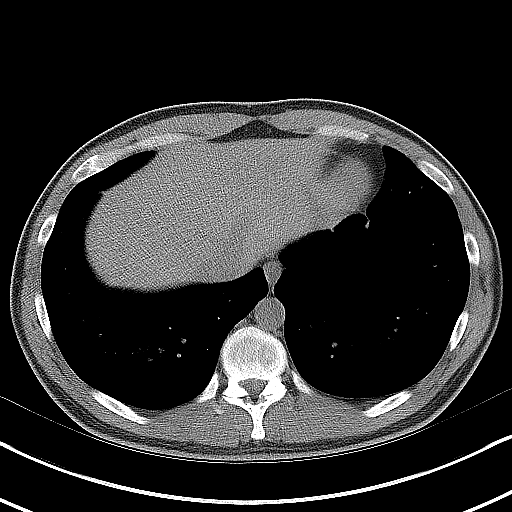
[im 20/24  lung]
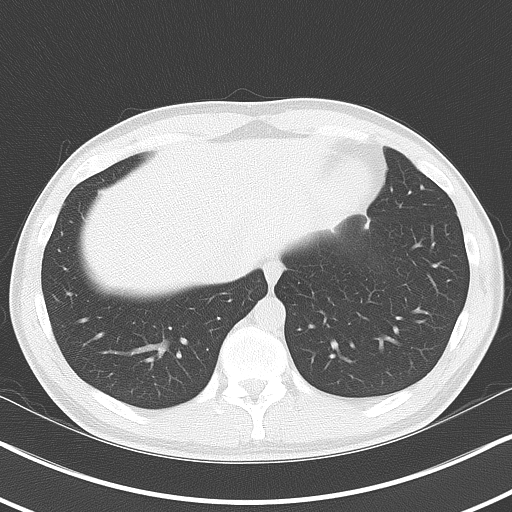

[Series 5: coronal · coronal · 0.75mm/px · 3 of 133 slices shown, 4 images]
[im 45/133  soft-tissue]
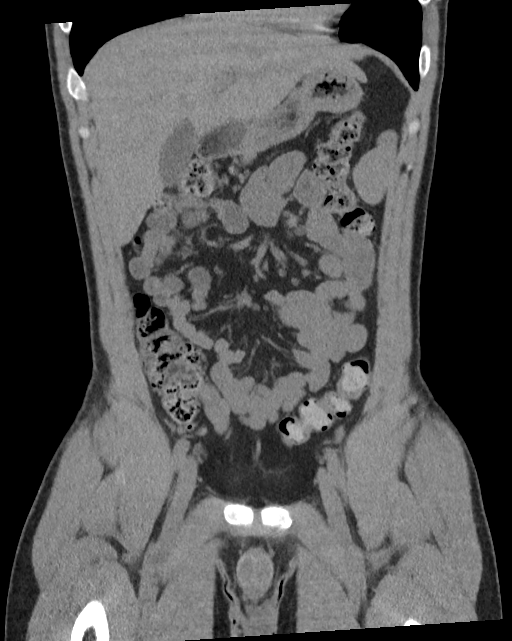
[im 59/133  soft-tissue]
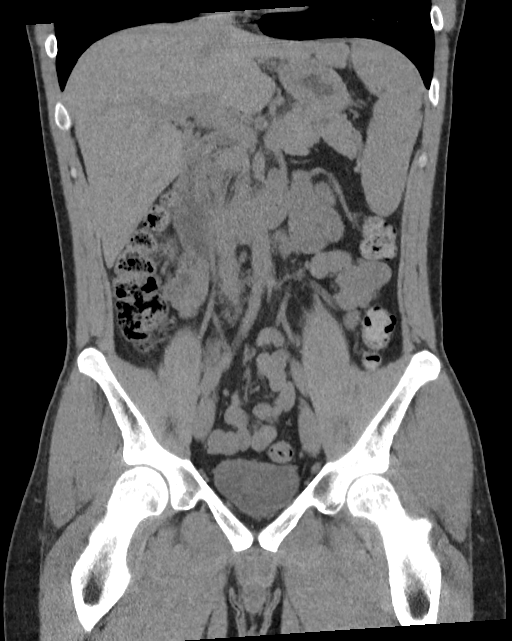
[im 59/133  bone]
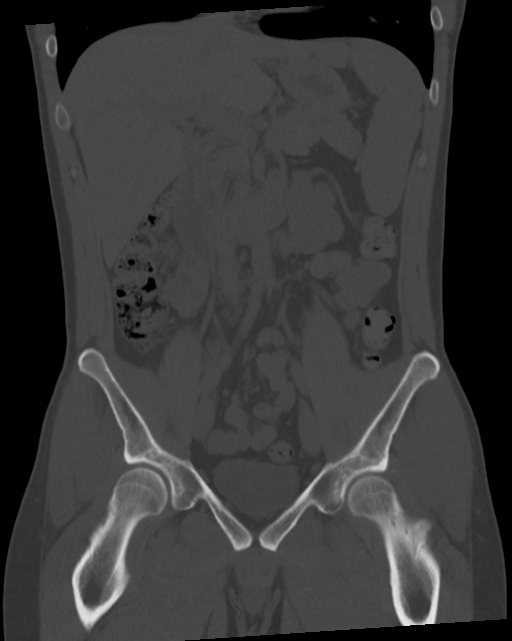
[im 74/133  soft-tissue]
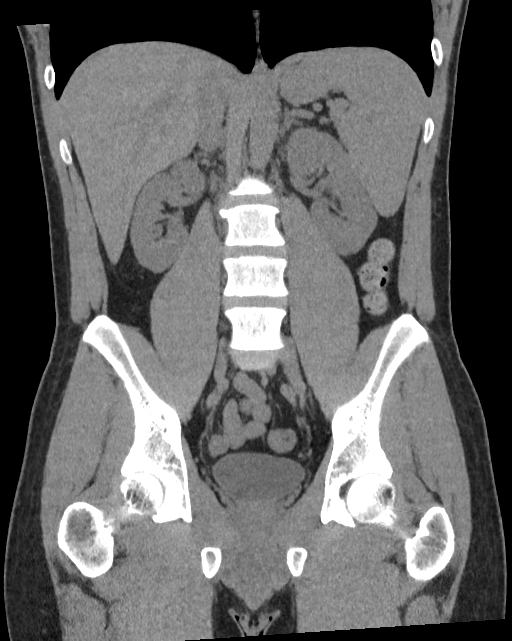

[Series 6: sagittal · sagittal · 0.52mm/px · 1 of 192 slices shown, 2 images]
[im 64/192  soft-tissue]
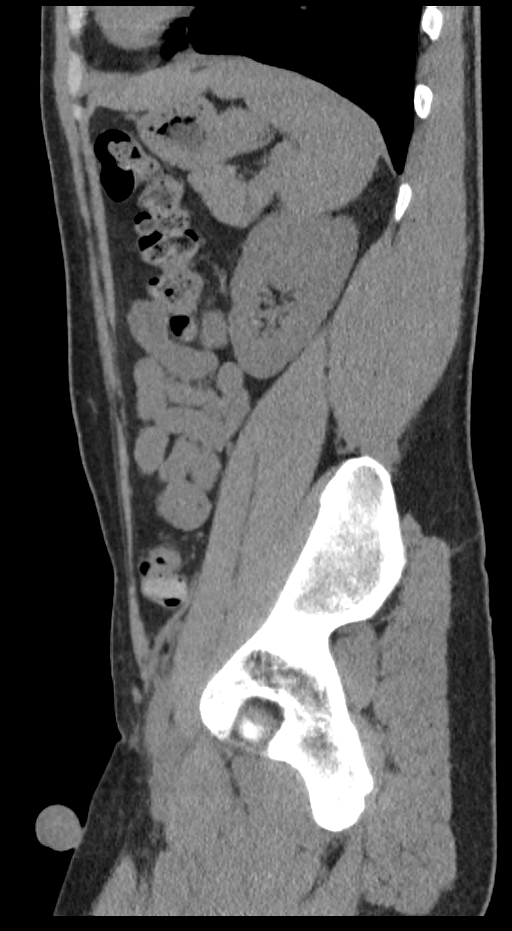
[im 64/192  bone]
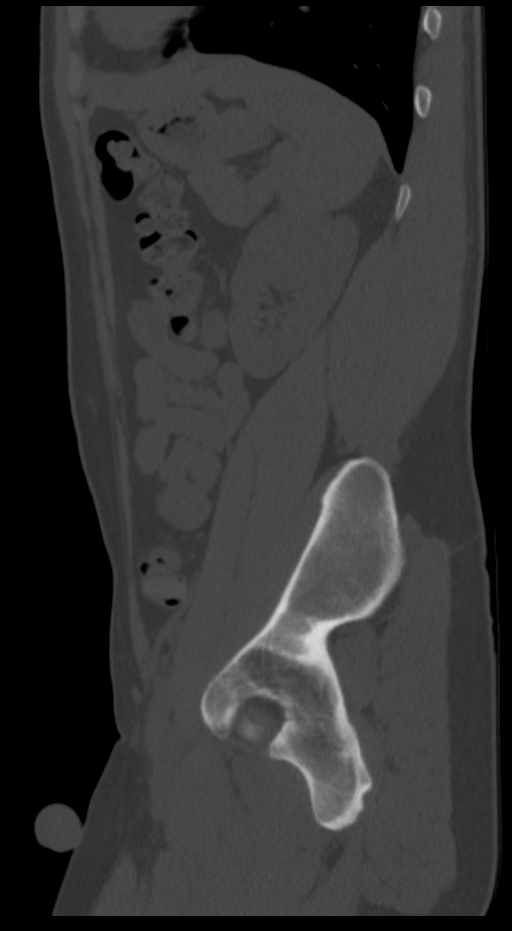

[9 of 46 positions shown; findings below may reference images not displayed]

FINDINGS: Lower chest: No acute findings.

Hepatobiliary: No mass visualized on this unenhanced exam.
Gallbladder is unremarkable. No evidence of biliary ductal
dilatation.

Pancreas: No mass or inflammatory process visualized on this
unenhanced exam.

Spleen:  Within normal limits in size.

Adrenals/Urinary tract: Tiny 1-2 mm punctate calculus is seen in the
midpole right kidney. No evidence of ureteral calculi or
hydronephrosis. Unremarkable unopacified urinary bladder.

Stomach/Bowel: No evidence of obstruction, inflammatory process, or
abnormal fluid collections. Normal appendix visualized.

Vascular/Lymphatic: No pathologically enlarged lymph nodes
identified. No evidence of abdominal aortic aneurysm.

Reproductive:  No mass or other significant abnormality.

Other:  None.

Musculoskeletal:  No suspicious bone lesions identified.
IMPRESSION: Tiny punctate right renal calculus. No evidence of ureteral calculi,
hydronephrosis, or other acute findings.

## 2022-02-11 ENCOUNTER — Other Ambulatory Visit: Payer: Self-pay

## 2022-02-11 ENCOUNTER — Emergency Department
Admission: EM | Admit: 2022-02-11 | Discharge: 2022-02-11 | Disposition: A | Discharge status: Home / Self Care | Payer: Self-pay | Attending: Emergency Medicine | Admitting: Emergency Medicine

## 2022-02-11 DIAGNOSIS — Z202 Contact with and (suspected) exposure to infections with a predominantly sexual mode of transmission: Secondary | ICD-10-CM | POA: Insufficient documentation

## 2022-02-11 LAB — URINALYSIS, ROUTINE W REFLEX MICROSCOPIC
Bacteria, UA: NONE SEEN
Bilirubin Urine: NEGATIVE
Glucose, UA: NEGATIVE mg/dL
Hgb urine dipstick: NEGATIVE
Ketones, ur: NEGATIVE mg/dL
Leukocytes,Ua: NEGATIVE
Nitrite: NEGATIVE
Protein, ur: 30 mg/dL — AB
Specific Gravity, Urine: 1.023 (ref 1.005–1.030)
Squamous Epithelial / HPF: NONE SEEN (ref 0–5)
pH: 6 (ref 5.0–8.0)

## 2022-02-11 LAB — CHLAMYDIA/NGC RT PCR (ARMC ONLY)
Chlamydia Tr: NOT DETECTED
N gonorrhoeae: NOT DETECTED

## 2022-02-11 LAB — RAPID HIV SCREEN (HIV 1/2 AB+AG)
HIV 1/2 Antibodies: NONREACTIVE
HIV-1 P24 Antigen - HIV24: NONREACTIVE

## 2022-02-11 MED ORDER — DOXYCYCLINE MONOHYDRATE 100 MG PO TABS: 100.0000 mg | ORAL_TABLET | Freq: Two times a day (BID) | ORAL | 0 refills | Status: AC

## 2022-02-11 MED ORDER — CEFTRIAXONE SODIUM 1 G IJ SOLR: 500.0000 mg | Freq: Once | INTRAMUSCULAR | Status: DC

## 2022-02-11 NOTE — ED Triage Notes (Signed)
Pt here for STD check. Pt denies any symptoms at this time. Per pt, his girlfriend has tested positive for an STD.

## 2022-02-11 NOTE — Discharge Instructions (Addendum)
Your gonorrhea and Chlamydia tests were negative, though because you were exposed, the recommendations are to treat you anyways.  You refused the shot of ceftriaxone.  The RPR test for syphilis is still pending.  Please take the antibiotics as prescribed.  Remember that there are many other STDs that and you should follow up with a primary care doctor to have the full panel of testing performed. Please do not have sexual intercourse until fully and properly tested and treated. Your partner also needs to be tested and treated.  Return for any new, worsening, or change in symptoms or other concerns.  It was a pleasure caring for you today.

## 2022-02-11 NOTE — ED Notes (Signed)
Provider at bedside. Per triage note, pt believes partner tested positive for STI and requests testing.

## 2022-02-11 NOTE — ED Provider Notes (Signed)
[facility] Provider Note    Event Date/Time   First MD Initiated Contact with Patient 02/11/22 1208     (approximate)   History   Exposure to STD   HPI  Jack Fields is a 31 y.o. male who presents today for evaluation for exposure to sexually transmitted disease.  Patient denies any symptoms, but was told by his girlfriend that she has an STD.  He believes it is chlamydia.  She told him today.  He denies having any symptoms himself.  He denies any penile discharge or burning with urination.  Denies abdominal pain or flank pain.  No fevers or chills.  No testicular pain or swelling.  There are no problems to display for this patient.         Physical Exam   Triage Vital Signs: ED Triage Vitals [02/11/22 1201]  Enc Vitals Group     BP      Pulse      Resp      Temp      Temp src      SpO2      Weight      Height      Head Circumference      Peak Flow      Pain Score 0     Pain Loc      Pain Edu?      Excl. in [place]?     Most recent vital signs: Vitals:   02/11/22 1512  BP: 117/74  Pulse: 60  Resp: 16  Temp: 98 F (36.7 C)  SpO2: 97%    Physical Exam Vitals and nursing note reviewed.  Constitutional:      General: Awake and alert. No acute distress.    Appearance: Normal appearance. The patient is normal weight.  HENT:     Head: Normocephalic and atraumatic.     Mouth: Mucous membranes are moist.  Eyes:     General: PERRL. Normal EOMs        Right eye: No discharge.        Left eye: No discharge.     Conjunctiva/sclera: Conjunctivae normal.  Cardiovascular:     Rate and Rhythm: Normal rate and regular rhythm.     Pulses: Normal pulses.  Pulmonary:     Effort: Pulmonary effort is normal. No respiratory distress.  Abdominal:     Abdomen is soft. There is no abdominal tenderness. No rebound or guarding. No distention. Patient declined GU exam Musculoskeletal:        General: No swelling. Normal range of motion.      Cervical back: Normal range of motion and neck supple.  Skin:    General: Skin is warm and dry.     Capillary Refill: Capillary refill takes less than 2 seconds.     Findings: No rash.  Neurological:     Mental Status: The patient is awake and alert.      ED Results / Procedures / Treatments   Labs (all labs ordered are listed, but only abnormal results are displayed) Labs Reviewed  URINALYSIS, ROUTINE W REFLEX MICROSCOPIC - Abnormal; Notable for the following components:      Result Value   Color, Urine YELLOW (*)    APPearance CLEAR (*)    Protein, ur 30 (*)    All other components within normal limits  CHLAMYDIA/NGC RT PCR ([facility] ONLY)            RAPID HIV SCREEN (HIV 1/2 AB+AG)  HIV ANTIBODY (ROUTINE TESTING W REFLEX)  RPR     EKG     RADIOLOGY     PROCEDURES:  Critical Care performed:   Procedures   MEDICATIONS ORDERED IN ED: Medications - No data to display   IMPRESSION / MDM / [place] / ED COURSE  I reviewed the triage vital signs and the nursing notes.   Differential diagnosis includes, but is not limited to, exposure to STD, gonorrhea, chlamydia, other sexually transmitted disease.  He agreed to testing but declined GU exam.  He understands that I may miss something without doing a GU exam and he understands but does not wish to undergo physical exam.  GC chlamydia and rapid HIV returned negative.  Given his positive and known exposure, recommended empiric treatment though patient declined IM shot. Agreed to take doxycycline but understands this is incomplete treatment. Patient was advised that there are many other STDs that patient could have, that we do not test for in the emergency department. Patient was advised to follow up with a primary care doctor to have the full panel of testing performed. Patient was advised to not have sexual intercourse until fully and properly tested and treated. Patient was advised that his partner also needs  to be tested and treated. Patient understands and agrees with plan.  Patient's presentation is most consistent with acute complicated illness / injury requiring diagnostic workup.      FINAL CLINICAL IMPRESSION(S) / ED DIAGNOSES   Final diagnoses:  STD exposure     Rx / DC Orders   ED Discharge Orders          Ordered    doxycycline (ADOXA) 100 MG tablet  2 times daily        02/11/22 1633             Note:  This document was prepared using Dragon voice recognition software and may include unintentional dictation errors.   Emeline Gins 02/11/22 1651    Nena Polio, MD 02/11/22 [phone]

## 2022-02-11 NOTE — ED Triage Notes (Signed)
Formatting of this note might be different from the original.  Pt here for STD check. Pt denies any symptoms at this time. Per pt, his girlfriend has tested positive for an STD.   Electronically signed by Cephus Slater, RN at 02/11/2022 12:02 PM EDT

## 2022-02-11 NOTE — ED Provider Notes (Signed)
Formatting of this note is different from the original.  Images from the original note were not included.      [facility]  Provider Note     Event Date/Time    First MD Initiated Contact with Patient 02/11/22 1208      (approximate)    History     Exposure to STD    HPI    Terry Singleton is a 31 y.o. male who presents today for evaluation for exposure to sexually transmitted disease.  Patient denies any symptoms, but was told by his girlfriend that she has an STD.  He believes it is chlamydia.  She told him today.  He denies having any symptoms himself.  He denies any penile discharge or burning with urination.  Denies abdominal pain or flank pain.  No fevers or chills.  No testicular pain or swelling.    There are no problems to display for this patient.            Physical Exam     Triage Vital Signs:  ED Triage Vitals [02/11/22 1201]   Enc Vitals Group      BP       Pulse       Resp       Temp       Temp src       SpO2       Weight       Height       Head Circumference       Peak Flow       Pain Score 0      Pain Loc       Pain Edu?       Excl. in GC?      Most recent vital signs:  Vitals:    02/11/22 1512   BP: 117/74   Pulse: 60   Resp: 16   Temp: 98 F (36.7 C)   SpO2: 97%     Physical Exam  Vitals and nursing note reviewed.   Constitutional:       General: Awake and alert. No acute distress.     Appearance: Normal appearance. The patient is normal weight.   HENT:      Head: Normocephalic and atraumatic.      Mouth: Mucous membranes are moist.   Eyes:      General: PERRL. Normal EOMs         Right eye: No discharge.         Left eye: No discharge.      Conjunctiva/sclera: Conjunctivae normal.   Cardiovascular:      Rate and Rhythm: Normal rate and regular rhythm.      Pulses: Normal pulses.   Pulmonary:      Effort: Pulmonary effort is normal. No respiratory distress.   Abdominal:      Abdomen is soft. There is no abdominal tenderness. No rebound or guarding. No distention.  Patient  declined GU exam  Musculoskeletal:         General: No swelling. Normal range of motion.      Cervical back: Normal range of motion and neck supple.   Skin:     General: Skin is warm and dry.      Capillary Refill: Capillary refill takes less than 2 seconds.      Findings: No rash.   Neurological:      Mental Status: The patient is awake and alert.  ED Results / Procedures / Treatments     Labs  (all labs ordered are listed, but only abnormal results are displayed)  Labs Reviewed   URINALYSIS, ROUTINE W REFLEX MICROSCOPIC - Abnormal; Notable for the following components:       Result Value    Color, Urine YELLOW (*)     APPearance CLEAR (*)     Protein, ur 30 (*)     All other components within normal limits   CHLAMYDIA/NGC RT PCR ([facility] ONLY)             RAPID HIV SCREEN (HIV 1/2 AB+AG)   HIV ANTIBODY (ROUTINE TESTING W REFLEX)   RPR     EKG    RADIOLOGY    PROCEDURES:    Critical Care performed:     Procedures    MEDICATIONS ORDERED IN ED:  Medications - No data to display    IMPRESSION / MDM / [place] / ED COURSE   I reviewed the triage vital signs and the nursing notes.    Differential diagnosis includes, but is not limited to, exposure to STD, gonorrhea, chlamydia, other sexually transmitted disease.  He agreed to testing but declined GU exam.  He understands that I may miss something without doing a GU exam and he understands but does not wish to undergo physical exam.    GC chlamydia and rapid HIV returned negative.  Given his positive and known exposure, recommended empiric treatment though patient declined IM shot. Agreed to take doxycycline but understands this is incomplete treatment. Patient was advised that there are many other STDs that patient could have, that we do not test for in the emergency department. Patient was advised to follow up with a primary care doctor to have the full panel of testing performed. Patient was advised to not have sexual intercourse until fully and properly  tested and treated. Patient was advised that his partner also needs to be tested and treated. Patient understands and agrees with plan.    Patient's presentation is most consistent with acute complicated illness / injury requiring diagnostic workup.        FINAL CLINICAL IMPRESSION(S) / ED DIAGNOSES     Final diagnoses:   STD exposure     Rx / DC Orders     ED Discharge Orders            Ordered     doxycycline (ADOXA) 100 MG tablet  2 times daily         02/11/22 1633               Note:  This document was prepared using Dragon voice recognition software and may include unintentional dictation errors.    Emeline Gins  02/11/22 1651      Nena Polio, MD  02/11/22 1835    Electronically signed by Nena Polio, MD at 02/11/2022  6:35 PM EDT    Associated attestation - Nena Polio, MD - 02/11/2022  6:35 PM EDT  Formatting of this note might be different from the original.  Attestation: Medical screening examination/treatment/procedure(s) were performed by non-physician practitioner and as supervising physician I was immediately available for consultation/collaboration.

## 2022-02-11 NOTE — ED Notes (Signed)
Formatting of this note might be different from the original.  Provider at bedside. Per triage note, pt believes partner tested positive for STI and requests testing.  Electronically signed by Charlotte Crumb, RN at 02/11/2022 12:23 PM EDT

## 2022-02-12 LAB — RPR: RPR Ser Ql: NONREACTIVE

## 2022-02-12 LAB — HIV ANTIBODY (ROUTINE TESTING W REFLEX): HIV Screen 4th Generation wRfx: NONREACTIVE

## 2022-03-10 ENCOUNTER — Other Ambulatory Visit: Payer: Self-pay

## 2022-03-10 ENCOUNTER — Emergency Department: Payer: Self-pay

## 2022-03-10 ENCOUNTER — Emergency Department
Admission: EM | Admit: 2022-03-10 | Discharge: 2022-03-10 | Disposition: A | Discharge status: Home / Self Care | Payer: Self-pay | Attending: Emergency Medicine | Admitting: Emergency Medicine

## 2022-03-10 DIAGNOSIS — Z20822 Contact with and (suspected) exposure to covid-19: Secondary | ICD-10-CM | POA: Insufficient documentation

## 2022-03-10 DIAGNOSIS — R0602 Shortness of breath: Secondary | ICD-10-CM | POA: Insufficient documentation

## 2022-03-10 DIAGNOSIS — R042 Hemoptysis: Secondary | ICD-10-CM | POA: Insufficient documentation

## 2022-03-10 DIAGNOSIS — R0789 Other chest pain: Secondary | ICD-10-CM | POA: Insufficient documentation

## 2022-03-10 LAB — BASIC METABOLIC PANEL
Anion gap: 9 (ref 5–15)
BUN: 13 mg/dL (ref 6–20)
CO2: 29 mmol/L (ref 22–32)
Calcium: 9.6 mg/dL (ref 8.9–10.3)
Chloride: 106 mmol/L (ref 98–111)
Creatinine, Ser: 0.95 mg/dL (ref 0.61–1.24)
GFR, Estimated: >60 mL/min (ref >60)
Glucose, Bld: 97 mg/dL (ref 70–99)
Potassium: 3.7 mmol/L (ref 3.5–5.1)
Sodium: 144 mmol/L (ref 135–145)

## 2022-03-10 LAB — CBC
HCT: 50.4 % (ref 39.0–52.0)
Hemoglobin: 16.1 g/dL (ref 13.0–17.0)
MCH: 29.3 pg (ref 26.0–34.0)
MCHC: 31.9 g/dL (ref 30.0–36.0)
MCV: 91.6 fL (ref 80.0–100.0)
Platelets: 264 10*3/uL (ref 150–400)
RBC: 5.5 MIL/uL (ref 4.22–5.81)
RDW: 13.1 % (ref 11.5–15.5)
WBC: 11.4 10*3/uL — ABNORMAL HIGH (ref 4.0–10.5)
nRBC: 0 % (ref 0.0–0.2)

## 2022-03-10 LAB — RESP PANEL BY RT-PCR (RSV, FLU A&B, COVID)  RVPGX2
Influenza A by PCR: NEGATIVE
Influenza B by PCR: NEGATIVE
Resp Syncytial Virus by PCR: NEGATIVE
SARS Coronavirus 2 by RT PCR: NEGATIVE

## 2022-03-10 LAB — TROPONIN I (HIGH SENSITIVITY)
Troponin I (High Sensitivity): 3 ng/L (ref <18)
Troponin I (High Sensitivity): <2 ng/L (ref <18)

## 2022-03-10 MED ORDER — IOHEXOL 350 MG/ML SOLN
75.0000 mL | Freq: Once | INTRAVENOUS | Status: AC | PRN
  Administered 2022-03-10: 75 mL via INTRAVENOUS

## 2022-03-10 NOTE — ED Notes (Signed)
See triage note  Presents with some chest discomfort and cough  States he has had these sx's for the past 6 months  Also states coughing up blood  Afebrile on arrival

## 2022-03-10 NOTE — ED Notes (Signed)
Patient c/o increased shortness of breath with "work, after smoking my cigar and at night."

## 2022-03-10 NOTE — ED Provider Notes (Signed)
[facility] Provider Note    Event Date/Time   First MD Initiated Contact with Patient 03/10/22 1104     (approximate)   History   Chest Pain and Hemoptysis   HPI  Jack Fields is a 31 y.o. male with a past medical history of ADD on Adderall who presents today for evaluation of chest pain, shortness of breath, and hemoptysis that has been ongoing for 6 months.  Patient reports that he has been intermittent but over the past 4 days has been more constant.  He reports that his hemoptysis is blood-tinged sputum.  He denies any vomiting or bloody stools.  He denies leg swelling.  He has not had any family history or personal history of PE or DVT.  No recent travel outside of [place].  He denies any recent incarceration or living in close quarters with other people.  He denies night sweats or weight loss.  There are no problems to display for this patient.         Physical Exam   Triage Vital Signs: ED Triage Vitals  Enc Vitals Group     BP 03/10/22 1015 (!) 140/85     Pulse Rate 03/10/22 1015 (!) 105     Resp 03/10/22 1015 15     Temp 03/10/22 1015 98.9 F (37.2 C)     Temp Source 03/10/22 1015 Oral     SpO2 03/10/22 1015 100 %     Weight 03/10/22 1016 170 lb (77.1 kg)     Height 03/10/22 1016 5\' 11"  (1.803 m)     Head Circumference --      Peak Flow --      Pain Score 03/10/22 1016 4     Pain Loc --      Pain Edu? --      Excl. in [place]? --     Most recent vital signs: Vitals:   03/10/22 1144 03/10/22 1300  BP: 110/71 113/84  Pulse: 76 76  Resp: 16 20  Temp:  98 F (36.7 C)  SpO2: 96%     Physical Exam Vitals and nursing note reviewed.  Constitutional:      General: Awake and alert. No acute distress.    Appearance: Normal appearance. The patient is normal weight.  HENT:     Head: Normocephalic and atraumatic.     Mouth: Mucous membranes are moist.  Eyes:     General: PERRL. Normal EOMs        Right eye: No discharge.         Left eye: No discharge.     Conjunctiva/sclera: Conjunctivae normal.  Cardiovascular:     Rate and Rhythm: Normal rate and regular rhythm.     Pulses: Normal pulses.  Pulmonary:     Effort: Pulmonary effort is normal. No respiratory distress.     Breath sounds: Normal breath sounds.  Abdominal:     Abdomen is soft. There is no abdominal tenderness. No rebound or guarding. No distention. Musculoskeletal:        General: No swelling. Normal range of motion.     Cervical back: Normal range of motion and neck supple.  Skin:    General: Skin is warm and dry.     Capillary Refill: Capillary refill takes less than 2 seconds.     Findings: No rash.  Neurological:     Mental Status: The patient is awake and alert.      ED Results / Procedures / Treatments  Labs (all labs ordered are listed, but only abnormal results are displayed) Labs Reviewed  CBC - Abnormal; Notable for the following components:      Result Value   WBC 11.4 (*)    All other components within normal limits  RESP PANEL BY RT-PCR (RSV, FLU A&B, COVID)  RVPGX2  BASIC METABOLIC PANEL  TROPONIN I (HIGH SENSITIVITY)  TROPONIN I (HIGH SENSITIVITY)     EKG     RADIOLOGY I independently reviewed and interpreted imaging and agree with radiologists findings.     PROCEDURES:  Critical Care performed:   Procedures   MEDICATIONS ORDERED IN ED: Medications  iohexol (OMNIPAQUE) 350 MG/ML injection 75 mL (75 mLs Intravenous Contrast Given 03/10/22 1203)     IMPRESSION / MDM / ASSESSMENT AND PLAN / ED COURSE  I reviewed the triage vital signs and the nursing notes.   Differential diagnosis includes, but is not limited to, bronchitis, pulmonary embolism, tuberculosis, bronchiectasis, pulmonary mass, hemangioma.  Patient is awake and alert, tachycardic on arrival to 105, though normal oxygen saturation of 100% on room air.  Demonstrates no increased work of breathing.  He is afebrile.  Labs obtained at  triage demonstrated leukocytosis to 11.  Troponin x2 is negative.  X-ray without acute cardiopulmonary abnormality.  COVID/flu is negative.  CTA obtained given his tachycardia on arrival and his hemoptysis which revealed no pulmonary embolism or thoracic aortic dissection, though does reveal a single prominent 1 cm left axillary lymph node.  He was instructed to follow-up with his outpatient provider for recheck, and possible FNA if indicated.  We discussed return precautions and the importance of close outpatient follow-up.  Patient understands and agrees with plan.  He was discharged in stable condition.   Patient's presentation is most consistent with acute complicated illness / injury requiring diagnostic workup.     FINAL CLINICAL IMPRESSION(S) / ED DIAGNOSES   Final diagnoses:  Atypical chest pain     Rx / DC Orders   ED Discharge Orders     None        Note:  This document was prepared using Dragon voice recognition software and may include unintentional dictation errors.   Jackelyn Hoehn, PA-C 03/10/22 1517    Pilar Jarvis, MD 03/10/22 2024

## 2022-03-10 NOTE — ED Triage Notes (Signed)
Pt in via POV, reports ongoing hemoptysis x approximately 6 months, also reports some associated chest pain, worse w/ exertion and physical labor.  Reports the pain has increased over the last week.    Ambulatory to triage, NAD noted at this time.

## 2022-03-10 NOTE — Discharge Instructions (Addendum)
Your blood tests and x-ray and CT scan were reassuring.  Please follow-up with your outpatient provider this week.  Please return for any new, worsening, or change in symptoms or other concerns.  It was a pleasure caring for you today.

## 2022-03-10 NOTE — ED Triage Notes (Signed)
Formatting of this note might be different from the original.  Pt in via POV, reports ongoing hemoptysis x approximately 6 months, also reports some associated chest pain, worse w/ exertion and physical labor.  Reports the pain has increased over the last week.      Ambulatory to triage, NAD noted at this time.    Electronically signed by Lawrence Marseilles, RN at 03/10/2022 10:15 AM EDT

## 2022-03-10 NOTE — ED Notes (Signed)
Formatting of this note might be different from the original.  Patient c/o increased shortness of breath with "work, after smoking my cigar and at night."  Electronically signed by Percell Belt, RN at 03/10/2022 11:35 AM EDT

## 2022-03-10 NOTE — ED Notes (Signed)
Formatting of this note might be different from the original.  See triage note  Presents with some chest discomfort and cough  States he has had these sx's for the past 6 months  Also states coughing up blood  Afebrile on arrival  Electronically signed by Arlyce Harman, RN at 03/10/2022 11:23 AM EDT

## 2022-03-10 NOTE — ED Provider Notes (Signed)
Formatting of this note is different from the original.  Images from the original note were not included.      [facility]  Provider Note     Event Date/Time    First MD Initiated Contact with Patient 03/10/22 1104      (approximate)    History     Chest Pain and Hemoptysis    HPI    Terry Singleton is a 31 y.o. male with a past medical history of ADD on Adderall who presents today for evaluation of chest pain, shortness of breath, and hemoptysis that has been ongoing for 6 months.  Patient reports that he has been intermittent but over the past 4 days has been more constant.  He reports that his hemoptysis is blood-tinged sputum.  He denies any vomiting or bloody stools.  He denies leg swelling.  He has not had any family history or personal history of PE or DVT.  No recent travel outside of [place].  He denies any recent incarceration or living in close quarters with other people.  He denies night sweats or weight loss.    There are no problems to display for this patient.            Physical Exam     Triage Vital Signs:  ED Triage Vitals   Enc Vitals Group      BP 03/10/22 1015 (!) 140/85      Pulse Rate 03/10/22 1015 (!) 105      Resp 03/10/22 1015 15      Temp 03/10/22 1015 98.9 F (37.2 C)      Temp Source 03/10/22 1015 Oral      SpO2 03/10/22 1015 100 %      Weight 03/10/22 1016 170 lb (77.1 kg)      Height 03/10/22 1016 5\' 11"  (1.803 m)      Head Circumference --       Peak Flow --       Pain Score 03/10/22 1016 4      Pain Loc --       Pain Edu? --       Excl. in [place]? --      Most recent vital signs:  Vitals:    03/10/22 1144 03/10/22 1300   BP: 110/71 113/84   Pulse: 76 76   Resp: 16 20   Temp:  98 F (36.7 C)   SpO2: 96%      Physical Exam  Vitals and nursing note reviewed.   Constitutional:       General: Awake and alert. No acute distress.     Appearance: Normal appearance. The patient is normal weight.   HENT:      Head: Normocephalic and atraumatic.      Mouth: Mucous  membranes are moist.   Eyes:      General: PERRL. Normal EOMs         Right eye: No discharge.         Left eye: No discharge.      Conjunctiva/sclera: Conjunctivae normal.   Cardiovascular:      Rate and Rhythm: Normal rate and regular rhythm.      Pulses: Normal pulses.   Pulmonary:      Effort: Pulmonary effort is normal. No respiratory distress.      Breath sounds: Normal breath sounds.   Abdominal:      Abdomen is soft. There is no abdominal tenderness. No rebound or guarding. No distention.  Musculoskeletal:         General: No swelling. Normal range of motion.      Cervical back: Normal range of motion and neck supple.   Skin:     General: Skin is warm and dry.      Capillary Refill: Capillary refill takes less than 2 seconds.      Findings: No rash.   Neurological:      Mental Status: The patient is awake and alert.       ED Results / Procedures / Treatments     Labs  (all labs ordered are listed, but only abnormal results are displayed)  Labs Reviewed   CBC - Abnormal; Notable for the following components:       Result Value    WBC 11.4 (*)     All other components within normal limits   RESP PANEL BY RT-PCR (RSV, FLU A&B, COVID)  RVPGX2   BASIC METABOLIC PANEL   TROPONIN I (HIGH SENSITIVITY)   TROPONIN I (HIGH SENSITIVITY)     EKG    RADIOLOGY  I independently reviewed and interpreted imaging and agree with radiologists findings.    PROCEDURES:    Critical Care performed:     Procedures    MEDICATIONS ORDERED IN ED:  Medications   iohexol (OMNIPAQUE) 350 MG/ML injection 75 mL (75 mLs Intravenous Contrast Given 03/10/22 1203)     IMPRESSION / MDM / ASSESSMENT AND PLAN / ED COURSE   I reviewed the triage vital signs and the nursing notes.    Differential diagnosis includes, but is not limited to, bronchitis, pulmonary embolism, tuberculosis, bronchiectasis, pulmonary mass, hemangioma.  Patient is awake and alert, tachycardic on arrival to 105, though normal oxygen saturation of 100% on room air.  Demonstrates  no increased work of breathing.  He is afebrile.  Labs obtained at triage demonstrated leukocytosis to 11.  Troponin x2 is negative.  X-ray without acute cardiopulmonary abnormality.  COVID/flu is negative.  CTA obtained given his tachycardia on arrival and his hemoptysis which revealed no pulmonary embolism or thoracic aortic dissection, though does reveal a single prominent 1 cm left axillary lymph node.  He was instructed to follow-up with his outpatient provider for recheck, and possible FNA if indicated.  We discussed return precautions and the importance of close outpatient follow-up.  Patient understands and agrees with plan.  He was discharged in stable condition.    Patient's presentation is most consistent with acute complicated illness / injury requiring diagnostic workup.    FINAL CLINICAL IMPRESSION(S) / ED DIAGNOSES     Final diagnoses:   Atypical chest pain     Rx / DC Orders     ED Discharge Orders       None         Note:  This document was prepared using Dragon voice recognition software and may include unintentional dictation errors.    Keturah Shavers  03/10/22 1517      Pilar Jarvis, MD  03/10/22 2024    Electronically signed by Pilar Jarvis, MD at 03/10/2022  8:24 PM EDT    Associated attestation - Pilar Jarvis, MD - 03/10/2022  8:24 PM EDT  Formatting of this note might be different from the original.  Attestation: Medical screening examination/treatment/procedure(s) were performed by non-physician practitioner and as supervising physician I was immediately available for consultation/collaboration.

## 2022-04-24 ENCOUNTER — Emergency Department: Admit: 2022-04-25 | Payer: MEDICAID

## 2022-04-24 DIAGNOSIS — R4182 Altered mental status, unspecified: Secondary | ICD-10-CM

## 2022-04-24 NOTE — ED Notes (Signed)
Patient very drowsy in triage.  Given 2mg  Narcan IV and immediately became much more alert.     Lucie Leather, RN  04/24/22 2154

## 2022-04-24 NOTE — ED Notes (Signed)
Patient to CT.     Lucie Leather, RN  04/24/22 [phone]
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
## 2022-04-25 ENCOUNTER — Inpatient Hospital Stay
Admit: 2022-04-25 | Discharge: 2022-04-25 | Disposition: A | Discharge status: Home / Self Care | Payer: MEDICAID | Attending: Emergency Medicine

## 2022-04-25 LAB — CBC WITH AUTO DIFFERENTIAL
Absolute Immature Granulocyte: 0 10*3/uL (ref 0.00–0.04)
Basophils %: 0 % (ref 0–1)
Basophils Absolute: 0 10*3/uL (ref 0.0–0.1)
Eosinophils %: 2 % (ref 0–7)
Eosinophils Absolute: 0.2 10*3/uL (ref 0.0–0.4)
Hematocrit: 41.4 % (ref 36.6–50.3)
Hemoglobin: 13.8 g/dL (ref 12.1–17.0)
Immature Granulocytes: 1 % — ABNORMAL HIGH (ref 0–0.5)
Lymphocytes %: 25 % (ref 12–49)
Lymphocytes Absolute: 2.1 10*3/uL (ref 0.8–3.5)
MCH: 30.4 PG (ref 26.0–34.0)
MCHC: 33.3 g/dL (ref 30.0–36.5)
MCV: 91.2 FL (ref 80.0–99.0)
MPV: 8.8 FL — ABNORMAL LOW (ref 8.9–12.9)
Monocytes %: 7 % (ref 5–13)
Monocytes Absolute: 0.6 10*3/uL (ref 0.0–1.0)
Neutrophils %: 65 % (ref 32–75)
Neutrophils Absolute: 5.5 10*3/uL (ref 1.8–8.0)
Nucleated RBCs: 0 PER 100 WBC
Platelets: 254 10*3/uL (ref 150–400)
RBC: 4.54 M/uL (ref 4.10–5.70)
RDW: 12.9 % (ref 11.5–14.5)
WBC: 8.3 10*3/uL (ref 4.1–11.1)
nRBC: 0 10*3/uL (ref 0.00–0.01)

## 2022-04-25 LAB — COMPREHENSIVE METABOLIC PANEL
ALT: 15 U/L (ref 12–78)
AST: 13 U/L — ABNORMAL LOW (ref 15–37)
Albumin/Globulin Ratio: 1 — ABNORMAL LOW (ref 1.1–2.2)
Albumin: 3.4 g/dL — ABNORMAL LOW (ref 3.5–5.0)
Alk Phosphatase: 89 U/L (ref 45–117)
Anion Gap: 5 mmol/L (ref 5–15)
BUN: 16 mg/dL (ref 6–20)
Bun/Cre Ratio: 15 (ref 12–20)
CO2: 30 mmol/L (ref 21–32)
Calcium: 8.7 mg/dL (ref 8.5–10.1)
Chloride: 103 mmol/L (ref 97–108)
Creatinine: 1.06 mg/dL (ref 0.70–1.30)
Est, Glom Filt Rate: >60 mL/min/{1.73_m2} (ref >60)
Globulin: 3.3 g/dL (ref 2.0–4.0)
Glucose: 117 mg/dL — ABNORMAL HIGH (ref 65–100)
Potassium: 3.2 mmol/L — ABNORMAL LOW (ref 3.5–5.1)
Sodium: 138 mmol/L (ref 136–145)
Total Bilirubin: 0.2 mg/dL (ref 0.2–1.0)
Total Protein: 6.7 g/dL (ref 6.4–8.2)

## 2022-04-25 LAB — ETHANOL: Ethanol Lvl: <10 mg/dL (ref <10)

## 2022-04-25 MED ORDER — NALOXONE HCL 2 MG/2ML IJ SOSY
2 MG/ML | INTRAMUSCULAR | Status: AC
Start: 2022-04-25 — End: 2022-04-24
  Administered 2022-04-25: 03:00:00 2 via INTRAVENOUS

## 2022-04-25 MED ORDER — NALOXONE HCL 2 MG/2ML IJ SOSY
2 MG/ML | INTRAMUSCULAR | Status: AC
Start: 2022-04-25 — End: 2022-04-24

## 2022-04-25 MED FILL — NALOXONE HCL 2 MG/2ML IJ SOSY: 2 MG/ML | INTRAMUSCULAR | Qty: 2
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
## 2022-04-25 NOTE — ED Notes (Signed)
Pt awakened for assessment. Pt refuses to give UA. Pleasant and oriented . States he feels better. MD aware and pt will be dced     Darnelle Catalan, RN  04/25/22 [phone]

## 2022-04-26 LAB — EKG 12-LEAD
Atrial Rate: 95 {beats}/min
P Axis: 52 degrees
P-R Interval: 172 ms
Q-T Interval: 353 ms
QRS Duration: 95 ms
QTc Calculation (Bazett): 444 ms
R Axis: 13 degrees
T Axis: 43 degrees
Ventricular Rate: 95 {beats}/min

## 2023-02-17 IMAGING — CT CT RENAL STONE PROTOCOL
2 of 4 series · 16 of 46 positions shown, 18 images · non-contrast
Comparison: CT abdomen/pelvis 10/16/2019

CLINICAL DATA: Left flank pain

EXAM:
CT ABDOMEN AND PELVIS WITHOUT CONTRAST
TECHNIQUE: Multidetector CT imaging of the abdomen and pelvis was performed
following the standard protocol without IV contrast.

[Series 2: stone full standard · axial · 0.73mm/px · z∈[-1165,-760]mm · 13 of 89 slices shown, 15 images]
[im 4/89  soft-tissue]
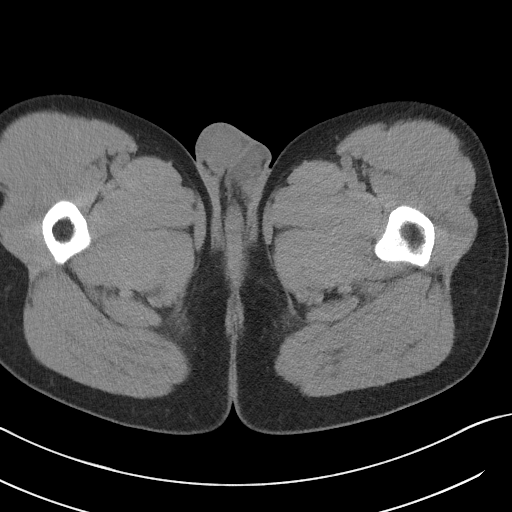
[im 4/89  bone]
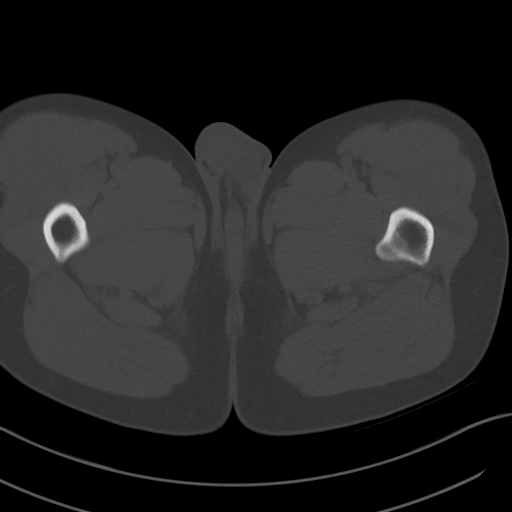
[im 11/89  soft-tissue]
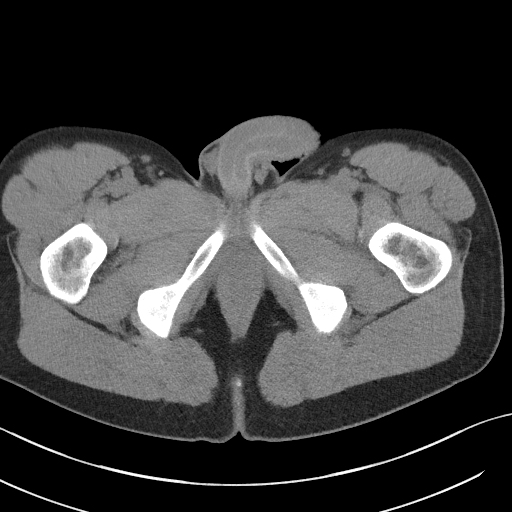
[im 17/89  soft-tissue]
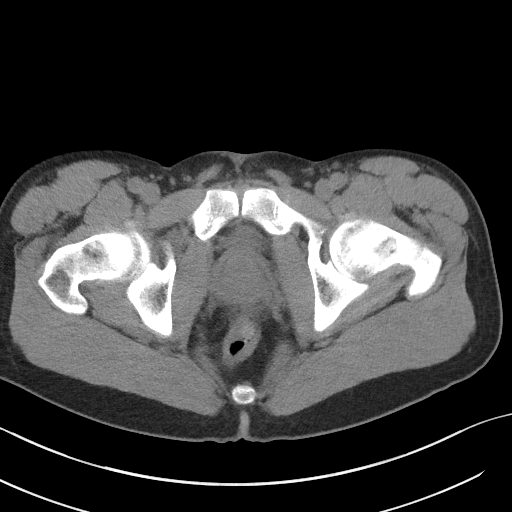
[im 24/89  soft-tissue]
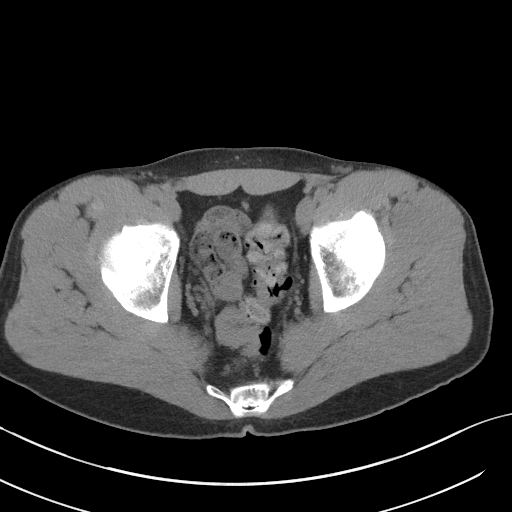
[im 31/89  soft-tissue]
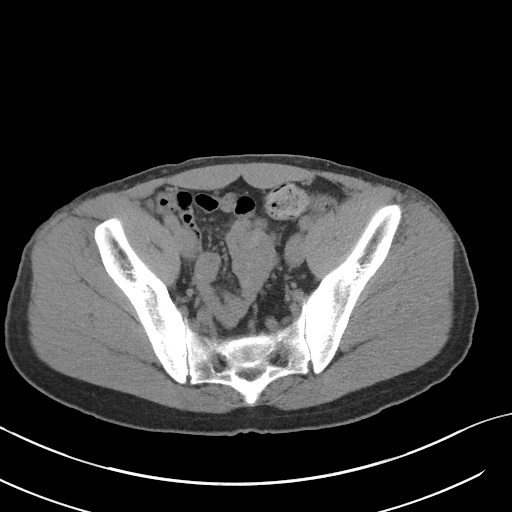
[im 38/89  soft-tissue]
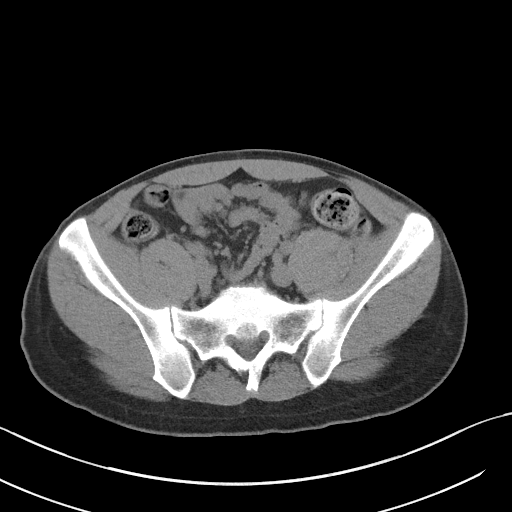
[im 45/89  soft-tissue]
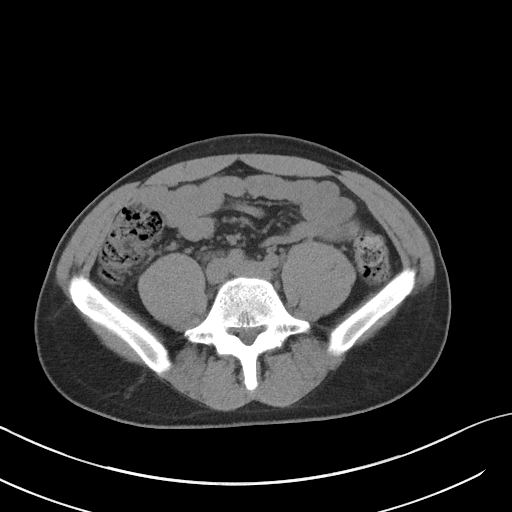
[im 51/89  soft-tissue]
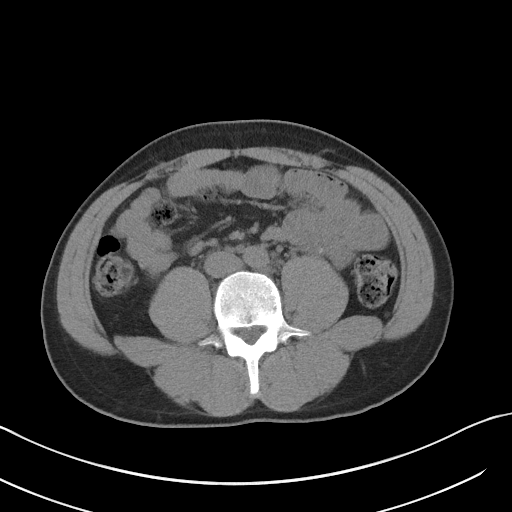
[im 58/89  soft-tissue]
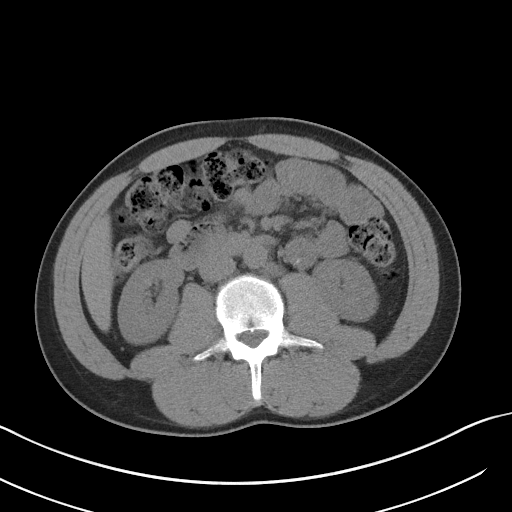
[im 58/89  bone]
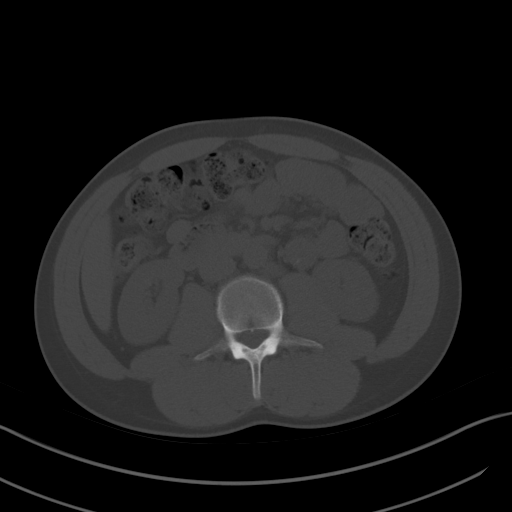
[im 65/89  soft-tissue]
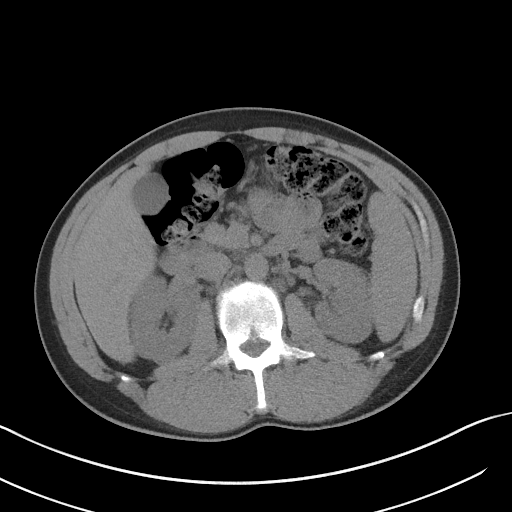
[im 72/89  soft-tissue]
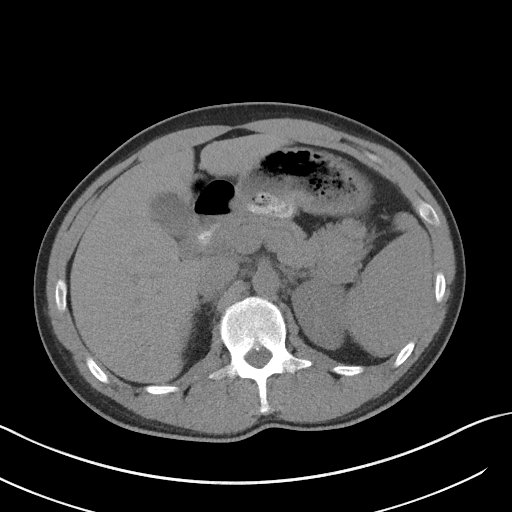
[im 78/89  soft-tissue]
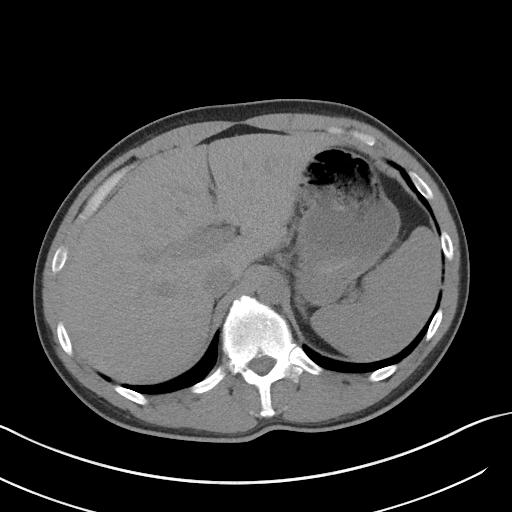
[im 85/89  soft-tissue]
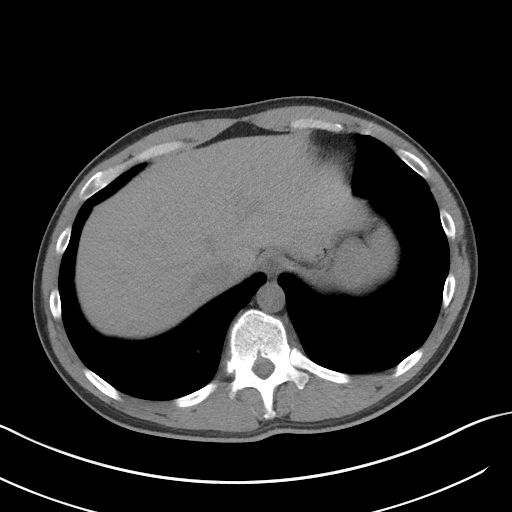

[Series 5: coronal · coronal · 0.73mm/px · 3 of 129 slices shown]
[im 43/129  soft-tissue]
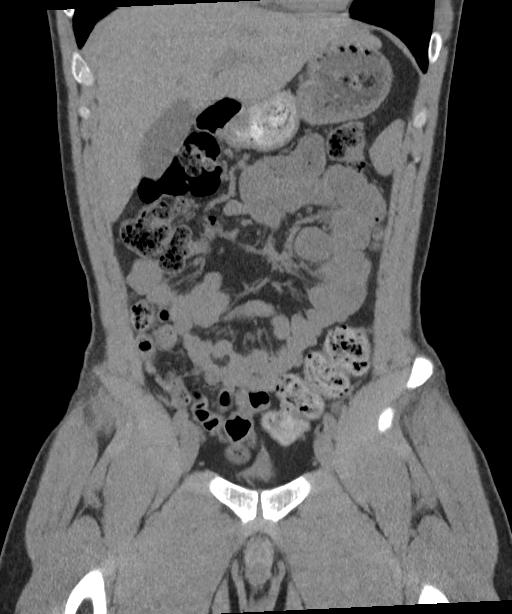
[im 57/129  soft-tissue]
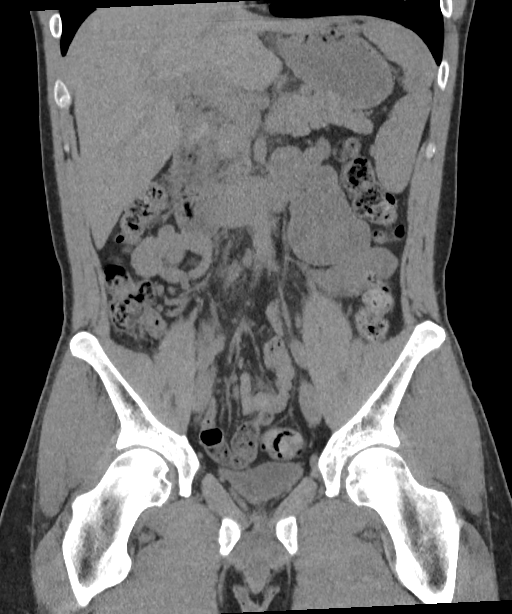
[im 72/129  soft-tissue]
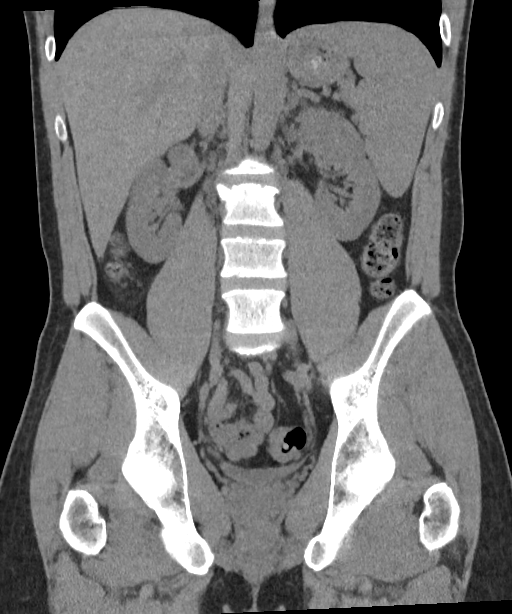

[16 of 46 positions shown; findings below may reference images not displayed]

FINDINGS: Lower chest: The lung bases are clear.  The heart is not imaged.

Hepatobiliary: The liver and gallbladder are unremarkable. There is
no biliary ductal dilatation.

Pancreas: Unremarkable.

Spleen: Unremarkable.

Adrenals/Urinary Tract: The adrenals are unremarkable.

The kidneys are unremarkable, with no focal lesion, stone,
hydronephrosis, or hydroureter. The bladder is decompressed but
grossly unremarkable.

Stomach/Bowel: The stomach is unremarkable. There is no evidence of
bowel obstruction. There is no abnormal bowel wall thickening or
inflammatory change. Appendix is normal.

Vascular/Lymphatic: The abdominal aorta is normal in course and
caliber. There are scattered prominent retroperitoneal lymph nodes
measuring up to 9 mm in short axis (2-32). This is overall not
significantly changed compared to the prior study and remains
nonspecific.

Reproductive: The prostate and seminal vesicles are unremarkable.

Other: There is no ascites or free air.

Musculoskeletal: There is no acute osseous abnormality or aggressive
osseous lesion. There is mild multilevel degenerative change of the
lumbar spine with a prominent Schmorl's node indenting the superior
L3 endplate.
IMPRESSION: 1. No renal stones, hydronephrosis, or hydroureter.
2. No other acute findings in the abdomen or pelvis.
